# Patient Record
Sex: Male | Born: 1960
Health system: Southern US, Community
[De-identification: ages and names within clinical notes are randomized; demographics above are authoritative.]

## PROBLEM LIST (undated history)

## (undated) DIAGNOSIS — B182 Chronic viral hepatitis C: Secondary | ICD-10-CM

## (undated) DIAGNOSIS — J449 Chronic obstructive pulmonary disease, unspecified: Secondary | ICD-10-CM

## (undated) DIAGNOSIS — F1910 Other psychoactive substance abuse, uncomplicated: Secondary | ICD-10-CM

## (undated) DIAGNOSIS — E119 Type 2 diabetes mellitus without complications: Secondary | ICD-10-CM

## (undated) DIAGNOSIS — N189 Chronic kidney disease, unspecified: Secondary | ICD-10-CM

---

## 2003-12-13 ENCOUNTER — Ambulatory Visit: Payer: Self-pay | Admitting: Gastroenterology

## 2003-12-27 ENCOUNTER — Ambulatory Visit: Payer: Self-pay | Admitting: Gastroenterology

## 2004-01-10 ENCOUNTER — Ambulatory Visit: Payer: Self-pay | Admitting: Gastroenterology

## 2004-02-07 ENCOUNTER — Ambulatory Visit: Payer: Self-pay | Admitting: Gastroenterology

## 2004-03-06 ENCOUNTER — Ambulatory Visit: Payer: Self-pay | Admitting: Gastroenterology

## 2004-04-10 ENCOUNTER — Ambulatory Visit: Payer: Self-pay | Admitting: Internal Medicine

## 2004-05-11 ENCOUNTER — Ambulatory Visit: Payer: Self-pay | Admitting: Gastroenterology

## 2011-02-05 DIAGNOSIS — Z79899 Other long term (current) drug therapy: Secondary | ICD-10-CM | POA: Diagnosis not present

## 2011-03-07 DIAGNOSIS — K219 Gastro-esophageal reflux disease without esophagitis: Secondary | ICD-10-CM | POA: Diagnosis not present

## 2011-03-07 DIAGNOSIS — Z6833 Body mass index (BMI) 33.0-33.9, adult: Secondary | ICD-10-CM | POA: Diagnosis not present

## 2011-05-12 DIAGNOSIS — L0201 Cutaneous abscess of face: Secondary | ICD-10-CM | POA: Diagnosis not present

## 2011-05-12 DIAGNOSIS — N62 Hypertrophy of breast: Secondary | ICD-10-CM | POA: Diagnosis not present

## 2011-05-22 DIAGNOSIS — G8929 Other chronic pain: Secondary | ICD-10-CM | POA: Diagnosis not present

## 2011-05-22 DIAGNOSIS — Z5181 Encounter for therapeutic drug level monitoring: Secondary | ICD-10-CM | POA: Diagnosis not present

## 2011-06-19 DIAGNOSIS — Z5181 Encounter for therapeutic drug level monitoring: Secondary | ICD-10-CM | POA: Diagnosis not present

## 2011-06-19 DIAGNOSIS — G8929 Other chronic pain: Secondary | ICD-10-CM | POA: Diagnosis not present

## 2011-07-16 DIAGNOSIS — Z5181 Encounter for therapeutic drug level monitoring: Secondary | ICD-10-CM | POA: Diagnosis not present

## 2011-07-16 DIAGNOSIS — G8929 Other chronic pain: Secondary | ICD-10-CM | POA: Diagnosis not present

## 2011-08-13 DIAGNOSIS — M169 Osteoarthritis of hip, unspecified: Secondary | ICD-10-CM | POA: Diagnosis not present

## 2011-08-13 DIAGNOSIS — G894 Chronic pain syndrome: Secondary | ICD-10-CM | POA: Diagnosis not present

## 2011-08-13 DIAGNOSIS — G547 Phantom limb syndrome without pain: Secondary | ICD-10-CM | POA: Diagnosis not present

## 2011-08-28 DIAGNOSIS — Z6833 Body mass index (BMI) 33.0-33.9, adult: Secondary | ICD-10-CM | POA: Diagnosis not present

## 2011-08-28 DIAGNOSIS — N62 Hypertrophy of breast: Secondary | ICD-10-CM | POA: Diagnosis not present

## 2011-08-28 DIAGNOSIS — I10 Essential (primary) hypertension: Secondary | ICD-10-CM | POA: Diagnosis not present

## 2011-08-28 DIAGNOSIS — N529 Male erectile dysfunction, unspecified: Secondary | ICD-10-CM | POA: Diagnosis not present

## 2011-09-05 DIAGNOSIS — M169 Osteoarthritis of hip, unspecified: Secondary | ICD-10-CM | POA: Diagnosis not present

## 2011-09-05 DIAGNOSIS — G894 Chronic pain syndrome: Secondary | ICD-10-CM | POA: Diagnosis not present

## 2011-09-05 DIAGNOSIS — G547 Phantom limb syndrome without pain: Secondary | ICD-10-CM | POA: Diagnosis not present

## 2011-09-06 DIAGNOSIS — N62 Hypertrophy of breast: Secondary | ICD-10-CM | POA: Diagnosis not present

## 2011-10-08 DIAGNOSIS — Z5181 Encounter for therapeutic drug level monitoring: Secondary | ICD-10-CM | POA: Diagnosis not present

## 2011-10-08 DIAGNOSIS — G8929 Other chronic pain: Secondary | ICD-10-CM | POA: Diagnosis not present

## 2011-10-24 DIAGNOSIS — G4700 Insomnia, unspecified: Secondary | ICD-10-CM | POA: Diagnosis not present

## 2011-10-24 DIAGNOSIS — F411 Generalized anxiety disorder: Secondary | ICD-10-CM | POA: Diagnosis not present

## 2011-10-24 DIAGNOSIS — N62 Hypertrophy of breast: Secondary | ICD-10-CM | POA: Diagnosis not present

## 2011-10-25 DIAGNOSIS — N62 Hypertrophy of breast: Secondary | ICD-10-CM | POA: Diagnosis not present

## 2011-10-25 DIAGNOSIS — I10 Essential (primary) hypertension: Secondary | ICD-10-CM | POA: Diagnosis not present

## 2011-10-25 DIAGNOSIS — N63 Unspecified lump in unspecified breast: Secondary | ICD-10-CM | POA: Diagnosis not present

## 2011-10-25 DIAGNOSIS — N644 Mastodynia: Secondary | ICD-10-CM | POA: Diagnosis not present

## 2012-01-11 DIAGNOSIS — Z79899 Other long term (current) drug therapy: Secondary | ICD-10-CM | POA: Diagnosis not present

## 2012-02-01 DIAGNOSIS — G8921 Chronic pain due to trauma: Secondary | ICD-10-CM | POA: Diagnosis not present

## 2012-02-01 DIAGNOSIS — Z5181 Encounter for therapeutic drug level monitoring: Secondary | ICD-10-CM | POA: Diagnosis not present

## 2012-02-01 DIAGNOSIS — Z79899 Other long term (current) drug therapy: Secondary | ICD-10-CM | POA: Diagnosis not present

## 2012-02-01 DIAGNOSIS — G894 Chronic pain syndrome: Secondary | ICD-10-CM | POA: Diagnosis not present

## 2012-02-01 DIAGNOSIS — M2550 Pain in unspecified joint: Secondary | ICD-10-CM | POA: Diagnosis not present

## 2012-02-21 DIAGNOSIS — F17200 Nicotine dependence, unspecified, uncomplicated: Secondary | ICD-10-CM | POA: Diagnosis not present

## 2012-02-21 DIAGNOSIS — J209 Acute bronchitis, unspecified: Secondary | ICD-10-CM | POA: Diagnosis not present

## 2012-02-21 DIAGNOSIS — J449 Chronic obstructive pulmonary disease, unspecified: Secondary | ICD-10-CM | POA: Diagnosis not present

## 2012-03-12 DIAGNOSIS — M545 Low back pain: Secondary | ICD-10-CM | POA: Diagnosis not present

## 2012-03-12 DIAGNOSIS — G8921 Chronic pain due to trauma: Secondary | ICD-10-CM | POA: Diagnosis not present

## 2012-03-26 DIAGNOSIS — M25559 Pain in unspecified hip: Secondary | ICD-10-CM | POA: Diagnosis not present

## 2012-04-18 DIAGNOSIS — Z8619 Personal history of other infectious and parasitic diseases: Secondary | ICD-10-CM | POA: Diagnosis not present

## 2012-04-18 DIAGNOSIS — S78119A Complete traumatic amputation at level between unspecified hip and knee, initial encounter: Secondary | ICD-10-CM | POA: Diagnosis not present

## 2012-04-18 DIAGNOSIS — K449 Diaphragmatic hernia without obstruction or gangrene: Secondary | ICD-10-CM | POA: Diagnosis not present

## 2012-04-18 DIAGNOSIS — K219 Gastro-esophageal reflux disease without esophagitis: Secondary | ICD-10-CM | POA: Diagnosis not present

## 2012-04-18 DIAGNOSIS — F17200 Nicotine dependence, unspecified, uncomplicated: Secondary | ICD-10-CM | POA: Diagnosis not present

## 2012-04-18 DIAGNOSIS — K5900 Constipation, unspecified: Secondary | ICD-10-CM | POA: Diagnosis not present

## 2012-04-18 DIAGNOSIS — D126 Benign neoplasm of colon, unspecified: Secondary | ICD-10-CM | POA: Diagnosis not present

## 2012-04-18 DIAGNOSIS — K2100 Gastro-esophageal reflux disease with esophagitis, without bleeding: Secondary | ICD-10-CM | POA: Diagnosis not present

## 2012-04-18 DIAGNOSIS — K648 Other hemorrhoids: Secondary | ICD-10-CM | POA: Diagnosis not present

## 2012-04-18 DIAGNOSIS — Z1211 Encounter for screening for malignant neoplasm of colon: Secondary | ICD-10-CM | POA: Diagnosis not present

## 2012-04-23 DIAGNOSIS — G8921 Chronic pain due to trauma: Secondary | ICD-10-CM | POA: Diagnosis not present

## 2012-04-23 DIAGNOSIS — M545 Low back pain: Secondary | ICD-10-CM | POA: Diagnosis not present

## 2012-05-21 DIAGNOSIS — G8921 Chronic pain due to trauma: Secondary | ICD-10-CM | POA: Diagnosis not present

## 2012-05-21 DIAGNOSIS — M545 Low back pain: Secondary | ICD-10-CM | POA: Diagnosis not present

## 2012-06-11 DIAGNOSIS — Z0000 Encounter for general adult medical examination without abnormal findings: Secondary | ICD-10-CM | POA: Diagnosis not present

## 2012-06-11 DIAGNOSIS — I10 Essential (primary) hypertension: Secondary | ICD-10-CM | POA: Diagnosis not present

## 2012-06-11 DIAGNOSIS — E785 Hyperlipidemia, unspecified: Secondary | ICD-10-CM | POA: Diagnosis not present

## 2012-06-11 DIAGNOSIS — Z6833 Body mass index (BMI) 33.0-33.9, adult: Secondary | ICD-10-CM | POA: Diagnosis not present

## 2012-06-11 DIAGNOSIS — R350 Frequency of micturition: Secondary | ICD-10-CM | POA: Diagnosis not present

## 2012-06-11 DIAGNOSIS — J209 Acute bronchitis, unspecified: Secondary | ICD-10-CM | POA: Diagnosis not present

## 2012-06-11 DIAGNOSIS — E119 Type 2 diabetes mellitus without complications: Secondary | ICD-10-CM | POA: Diagnosis not present

## 2012-06-18 DIAGNOSIS — M545 Low back pain: Secondary | ICD-10-CM | POA: Diagnosis not present

## 2012-06-18 DIAGNOSIS — G8921 Chronic pain due to trauma: Secondary | ICD-10-CM | POA: Diagnosis not present

## 2012-06-18 DIAGNOSIS — Z5181 Encounter for therapeutic drug level monitoring: Secondary | ICD-10-CM | POA: Diagnosis not present

## 2012-06-27 DIAGNOSIS — Z6834 Body mass index (BMI) 34.0-34.9, adult: Secondary | ICD-10-CM | POA: Diagnosis not present

## 2012-06-27 DIAGNOSIS — E7289 Other specified disorders of amino-acid metabolism: Secondary | ICD-10-CM | POA: Diagnosis not present

## 2012-06-27 DIAGNOSIS — R609 Edema, unspecified: Secondary | ICD-10-CM | POA: Diagnosis not present

## 2012-06-27 DIAGNOSIS — I10 Essential (primary) hypertension: Secondary | ICD-10-CM | POA: Diagnosis not present

## 2012-07-07 DIAGNOSIS — I10 Essential (primary) hypertension: Secondary | ICD-10-CM | POA: Diagnosis not present

## 2012-07-07 DIAGNOSIS — Z6833 Body mass index (BMI) 33.0-33.9, adult: Secondary | ICD-10-CM | POA: Diagnosis not present

## 2012-07-07 DIAGNOSIS — R609 Edema, unspecified: Secondary | ICD-10-CM | POA: Diagnosis not present

## 2012-07-16 DIAGNOSIS — G8921 Chronic pain due to trauma: Secondary | ICD-10-CM | POA: Diagnosis not present

## 2012-07-16 DIAGNOSIS — M545 Low back pain: Secondary | ICD-10-CM | POA: Diagnosis not present

## 2012-08-13 DIAGNOSIS — G8921 Chronic pain due to trauma: Secondary | ICD-10-CM | POA: Diagnosis not present

## 2012-08-13 DIAGNOSIS — M545 Low back pain: Secondary | ICD-10-CM | POA: Diagnosis not present

## 2012-09-09 DIAGNOSIS — G8921 Chronic pain due to trauma: Secondary | ICD-10-CM | POA: Diagnosis not present

## 2012-09-09 DIAGNOSIS — Z79899 Other long term (current) drug therapy: Secondary | ICD-10-CM | POA: Diagnosis not present

## 2012-09-09 DIAGNOSIS — Z5181 Encounter for therapeutic drug level monitoring: Secondary | ICD-10-CM | POA: Diagnosis not present

## 2012-09-09 DIAGNOSIS — G894 Chronic pain syndrome: Secondary | ICD-10-CM | POA: Diagnosis not present

## 2012-09-30 DIAGNOSIS — G8921 Chronic pain due to trauma: Secondary | ICD-10-CM | POA: Diagnosis not present

## 2012-09-30 DIAGNOSIS — Z79899 Other long term (current) drug therapy: Secondary | ICD-10-CM | POA: Diagnosis not present

## 2012-09-30 DIAGNOSIS — Z5181 Encounter for therapeutic drug level monitoring: Secondary | ICD-10-CM | POA: Diagnosis not present

## 2012-09-30 DIAGNOSIS — G894 Chronic pain syndrome: Secondary | ICD-10-CM | POA: Diagnosis not present

## 2012-10-09 DIAGNOSIS — Z6832 Body mass index (BMI) 32.0-32.9, adult: Secondary | ICD-10-CM | POA: Diagnosis not present

## 2012-10-09 DIAGNOSIS — I10 Essential (primary) hypertension: Secondary | ICD-10-CM | POA: Diagnosis not present

## 2012-10-09 DIAGNOSIS — E119 Type 2 diabetes mellitus without complications: Secondary | ICD-10-CM | POA: Diagnosis not present

## 2012-10-09 DIAGNOSIS — E785 Hyperlipidemia, unspecified: Secondary | ICD-10-CM | POA: Diagnosis not present

## 2012-10-27 DIAGNOSIS — G894 Chronic pain syndrome: Secondary | ICD-10-CM | POA: Diagnosis not present

## 2012-10-27 DIAGNOSIS — G8921 Chronic pain due to trauma: Secondary | ICD-10-CM | POA: Diagnosis not present

## 2012-10-27 DIAGNOSIS — Z5181 Encounter for therapeutic drug level monitoring: Secondary | ICD-10-CM | POA: Diagnosis not present

## 2012-10-27 DIAGNOSIS — Z79899 Other long term (current) drug therapy: Secondary | ICD-10-CM | POA: Diagnosis not present

## 2012-11-11 DIAGNOSIS — Z79899 Other long term (current) drug therapy: Secondary | ICD-10-CM | POA: Diagnosis not present

## 2012-11-11 DIAGNOSIS — G8921 Chronic pain due to trauma: Secondary | ICD-10-CM | POA: Diagnosis not present

## 2012-11-11 DIAGNOSIS — G894 Chronic pain syndrome: Secondary | ICD-10-CM | POA: Diagnosis not present

## 2012-11-11 DIAGNOSIS — Z5181 Encounter for therapeutic drug level monitoring: Secondary | ICD-10-CM | POA: Diagnosis not present

## 2012-12-09 DIAGNOSIS — Z5181 Encounter for therapeutic drug level monitoring: Secondary | ICD-10-CM | POA: Diagnosis not present

## 2012-12-09 DIAGNOSIS — G894 Chronic pain syndrome: Secondary | ICD-10-CM | POA: Diagnosis not present

## 2012-12-09 DIAGNOSIS — G8921 Chronic pain due to trauma: Secondary | ICD-10-CM | POA: Diagnosis not present

## 2012-12-09 DIAGNOSIS — Z79899 Other long term (current) drug therapy: Secondary | ICD-10-CM | POA: Diagnosis not present

## 2013-01-06 DIAGNOSIS — G8921 Chronic pain due to trauma: Secondary | ICD-10-CM | POA: Diagnosis not present

## 2013-01-06 DIAGNOSIS — Z79899 Other long term (current) drug therapy: Secondary | ICD-10-CM | POA: Diagnosis not present

## 2013-01-06 DIAGNOSIS — Z5181 Encounter for therapeutic drug level monitoring: Secondary | ICD-10-CM | POA: Diagnosis not present

## 2013-01-06 DIAGNOSIS — G894 Chronic pain syndrome: Secondary | ICD-10-CM | POA: Diagnosis not present

## 2013-01-14 DIAGNOSIS — F411 Generalized anxiety disorder: Secondary | ICD-10-CM | POA: Diagnosis not present

## 2013-01-14 DIAGNOSIS — E785 Hyperlipidemia, unspecified: Secondary | ICD-10-CM | POA: Diagnosis not present

## 2013-01-14 DIAGNOSIS — I10 Essential (primary) hypertension: Secondary | ICD-10-CM | POA: Diagnosis not present

## 2013-02-03 DIAGNOSIS — Z5181 Encounter for therapeutic drug level monitoring: Secondary | ICD-10-CM | POA: Diagnosis not present

## 2013-02-03 DIAGNOSIS — G894 Chronic pain syndrome: Secondary | ICD-10-CM | POA: Diagnosis not present

## 2013-02-03 DIAGNOSIS — G8921 Chronic pain due to trauma: Secondary | ICD-10-CM | POA: Diagnosis not present

## 2013-02-03 DIAGNOSIS — Z79899 Other long term (current) drug therapy: Secondary | ICD-10-CM | POA: Diagnosis not present

## 2013-03-03 DIAGNOSIS — M2550 Pain in unspecified joint: Secondary | ICD-10-CM | POA: Diagnosis not present

## 2013-03-03 DIAGNOSIS — Z79899 Other long term (current) drug therapy: Secondary | ICD-10-CM | POA: Diagnosis not present

## 2013-03-03 DIAGNOSIS — Z5181 Encounter for therapeutic drug level monitoring: Secondary | ICD-10-CM | POA: Diagnosis not present

## 2013-03-03 DIAGNOSIS — G894 Chronic pain syndrome: Secondary | ICD-10-CM | POA: Diagnosis not present

## 2013-03-04 DIAGNOSIS — M25569 Pain in unspecified knee: Secondary | ICD-10-CM | POA: Diagnosis not present

## 2013-03-05 DIAGNOSIS — M1710 Unilateral primary osteoarthritis, unspecified knee: Secondary | ICD-10-CM | POA: Diagnosis not present

## 2013-03-09 DIAGNOSIS — M1710 Unilateral primary osteoarthritis, unspecified knee: Secondary | ICD-10-CM | POA: Diagnosis not present

## 2013-03-31 DIAGNOSIS — Z5181 Encounter for therapeutic drug level monitoring: Secondary | ICD-10-CM | POA: Diagnosis not present

## 2013-03-31 DIAGNOSIS — M2550 Pain in unspecified joint: Secondary | ICD-10-CM | POA: Diagnosis not present

## 2013-03-31 DIAGNOSIS — Z79899 Other long term (current) drug therapy: Secondary | ICD-10-CM | POA: Diagnosis not present

## 2013-03-31 DIAGNOSIS — G894 Chronic pain syndrome: Secondary | ICD-10-CM | POA: Diagnosis not present

## 2013-04-02 DIAGNOSIS — IMO0001 Reserved for inherently not codable concepts without codable children: Secondary | ICD-10-CM | POA: Diagnosis not present

## 2013-04-02 DIAGNOSIS — E785 Hyperlipidemia, unspecified: Secondary | ICD-10-CM | POA: Diagnosis not present

## 2013-04-02 DIAGNOSIS — J209 Acute bronchitis, unspecified: Secondary | ICD-10-CM | POA: Diagnosis not present

## 2013-04-02 DIAGNOSIS — Z6834 Body mass index (BMI) 34.0-34.9, adult: Secondary | ICD-10-CM | POA: Diagnosis not present

## 2013-04-02 DIAGNOSIS — I10 Essential (primary) hypertension: Secondary | ICD-10-CM | POA: Diagnosis not present

## 2013-05-27 DIAGNOSIS — Z6834 Body mass index (BMI) 34.0-34.9, adult: Secondary | ICD-10-CM | POA: Diagnosis not present

## 2013-05-27 DIAGNOSIS — J209 Acute bronchitis, unspecified: Secondary | ICD-10-CM | POA: Diagnosis not present

## 2013-05-27 DIAGNOSIS — F17200 Nicotine dependence, unspecified, uncomplicated: Secondary | ICD-10-CM | POA: Diagnosis not present

## 2013-05-28 DIAGNOSIS — Z79899 Other long term (current) drug therapy: Secondary | ICD-10-CM | POA: Diagnosis not present

## 2013-05-28 DIAGNOSIS — G894 Chronic pain syndrome: Secondary | ICD-10-CM | POA: Diagnosis not present

## 2013-05-28 DIAGNOSIS — M2550 Pain in unspecified joint: Secondary | ICD-10-CM | POA: Diagnosis not present

## 2013-05-28 DIAGNOSIS — Z5181 Encounter for therapeutic drug level monitoring: Secondary | ICD-10-CM | POA: Diagnosis not present

## 2013-06-04 DIAGNOSIS — F17200 Nicotine dependence, unspecified, uncomplicated: Secondary | ICD-10-CM | POA: Diagnosis not present

## 2013-06-04 DIAGNOSIS — J209 Acute bronchitis, unspecified: Secondary | ICD-10-CM | POA: Diagnosis not present

## 2013-06-04 DIAGNOSIS — Z6833 Body mass index (BMI) 33.0-33.9, adult: Secondary | ICD-10-CM | POA: Diagnosis not present

## 2013-06-25 DIAGNOSIS — Z79899 Other long term (current) drug therapy: Secondary | ICD-10-CM | POA: Diagnosis not present

## 2013-06-25 DIAGNOSIS — Z5181 Encounter for therapeutic drug level monitoring: Secondary | ICD-10-CM | POA: Diagnosis not present

## 2013-06-25 DIAGNOSIS — G894 Chronic pain syndrome: Secondary | ICD-10-CM | POA: Diagnosis not present

## 2013-06-25 DIAGNOSIS — M2550 Pain in unspecified joint: Secondary | ICD-10-CM | POA: Diagnosis not present

## 2013-07-07 DIAGNOSIS — L981 Factitial dermatitis: Secondary | ICD-10-CM | POA: Diagnosis not present

## 2013-07-07 DIAGNOSIS — L259 Unspecified contact dermatitis, unspecified cause: Secondary | ICD-10-CM | POA: Diagnosis not present

## 2013-07-09 DIAGNOSIS — Z5181 Encounter for therapeutic drug level monitoring: Secondary | ICD-10-CM | POA: Diagnosis not present

## 2013-07-09 DIAGNOSIS — M2550 Pain in unspecified joint: Secondary | ICD-10-CM | POA: Diagnosis not present

## 2013-07-09 DIAGNOSIS — Z79899 Other long term (current) drug therapy: Secondary | ICD-10-CM | POA: Diagnosis not present

## 2013-07-09 DIAGNOSIS — G894 Chronic pain syndrome: Secondary | ICD-10-CM | POA: Diagnosis not present

## 2013-08-26 DIAGNOSIS — Z79899 Other long term (current) drug therapy: Secondary | ICD-10-CM | POA: Diagnosis not present

## 2013-08-26 DIAGNOSIS — Z5181 Encounter for therapeutic drug level monitoring: Secondary | ICD-10-CM | POA: Diagnosis not present

## 2013-08-26 DIAGNOSIS — G894 Chronic pain syndrome: Secondary | ICD-10-CM | POA: Diagnosis not present

## 2013-08-26 DIAGNOSIS — M2550 Pain in unspecified joint: Secondary | ICD-10-CM | POA: Diagnosis not present

## 2013-09-01 DIAGNOSIS — Z6834 Body mass index (BMI) 34.0-34.9, adult: Secondary | ICD-10-CM | POA: Diagnosis not present

## 2013-09-01 DIAGNOSIS — I10 Essential (primary) hypertension: Secondary | ICD-10-CM | POA: Diagnosis not present

## 2013-09-01 DIAGNOSIS — IMO0001 Reserved for inherently not codable concepts without codable children: Secondary | ICD-10-CM | POA: Diagnosis not present

## 2013-09-01 DIAGNOSIS — R079 Chest pain, unspecified: Secondary | ICD-10-CM | POA: Diagnosis not present

## 2013-09-01 DIAGNOSIS — Z125 Encounter for screening for malignant neoplasm of prostate: Secondary | ICD-10-CM | POA: Diagnosis not present

## 2013-09-01 DIAGNOSIS — E785 Hyperlipidemia, unspecified: Secondary | ICD-10-CM | POA: Diagnosis not present

## 2013-09-02 DIAGNOSIS — R079 Chest pain, unspecified: Secondary | ICD-10-CM | POA: Diagnosis not present

## 2013-09-02 DIAGNOSIS — R609 Edema, unspecified: Secondary | ICD-10-CM | POA: Diagnosis not present

## 2013-09-02 DIAGNOSIS — E119 Type 2 diabetes mellitus without complications: Secondary | ICD-10-CM | POA: Diagnosis not present

## 2013-09-02 DIAGNOSIS — J449 Chronic obstructive pulmonary disease, unspecified: Secondary | ICD-10-CM | POA: Diagnosis not present

## 2013-09-14 DIAGNOSIS — H04129 Dry eye syndrome of unspecified lacrimal gland: Secondary | ICD-10-CM | POA: Diagnosis not present

## 2013-09-24 DIAGNOSIS — R079 Chest pain, unspecified: Secondary | ICD-10-CM | POA: Diagnosis not present

## 2013-10-05 DIAGNOSIS — R9439 Abnormal result of other cardiovascular function study: Secondary | ICD-10-CM | POA: Diagnosis not present

## 2013-10-05 DIAGNOSIS — I10 Essential (primary) hypertension: Secondary | ICD-10-CM | POA: Diagnosis not present

## 2013-10-05 DIAGNOSIS — Z79899 Other long term (current) drug therapy: Secondary | ICD-10-CM | POA: Diagnosis not present

## 2013-10-05 DIAGNOSIS — Z01818 Encounter for other preprocedural examination: Secondary | ICD-10-CM | POA: Diagnosis not present

## 2013-10-05 DIAGNOSIS — Z7982 Long term (current) use of aspirin: Secondary | ICD-10-CM | POA: Diagnosis not present

## 2013-10-05 DIAGNOSIS — R0789 Other chest pain: Secondary | ICD-10-CM | POA: Diagnosis not present

## 2013-10-05 DIAGNOSIS — Z87891 Personal history of nicotine dependence: Secondary | ICD-10-CM | POA: Diagnosis not present

## 2013-10-05 DIAGNOSIS — J449 Chronic obstructive pulmonary disease, unspecified: Secondary | ICD-10-CM | POA: Diagnosis not present

## 2013-10-05 DIAGNOSIS — I2510 Atherosclerotic heart disease of native coronary artery without angina pectoris: Secondary | ICD-10-CM | POA: Diagnosis not present

## 2013-10-05 DIAGNOSIS — B182 Chronic viral hepatitis C: Secondary | ICD-10-CM | POA: Diagnosis not present

## 2013-10-05 DIAGNOSIS — R079 Chest pain, unspecified: Secondary | ICD-10-CM | POA: Diagnosis not present

## 2013-10-05 DIAGNOSIS — E119 Type 2 diabetes mellitus without complications: Secondary | ICD-10-CM | POA: Diagnosis not present

## 2013-10-07 DIAGNOSIS — I209 Angina pectoris, unspecified: Secondary | ICD-10-CM | POA: Diagnosis not present

## 2013-10-08 DIAGNOSIS — G8921 Chronic pain due to trauma: Secondary | ICD-10-CM | POA: Diagnosis not present

## 2013-10-08 DIAGNOSIS — M545 Low back pain, unspecified: Secondary | ICD-10-CM | POA: Diagnosis not present

## 2013-11-02 DIAGNOSIS — Z79891 Long term (current) use of opiate analgesic: Secondary | ICD-10-CM | POA: Diagnosis not present

## 2013-11-02 DIAGNOSIS — M2550 Pain in unspecified joint: Secondary | ICD-10-CM | POA: Diagnosis not present

## 2013-11-02 DIAGNOSIS — Z5181 Encounter for therapeutic drug level monitoring: Secondary | ICD-10-CM | POA: Diagnosis not present

## 2013-11-02 DIAGNOSIS — G894 Chronic pain syndrome: Secondary | ICD-10-CM | POA: Diagnosis not present

## 2013-11-05 DIAGNOSIS — M5136 Other intervertebral disc degeneration, lumbar region: Secondary | ICD-10-CM | POA: Diagnosis not present

## 2013-11-05 DIAGNOSIS — M5412 Radiculopathy, cervical region: Secondary | ICD-10-CM | POA: Diagnosis not present

## 2013-12-15 DIAGNOSIS — Z79899 Other long term (current) drug therapy: Secondary | ICD-10-CM | POA: Diagnosis not present

## 2013-12-16 DIAGNOSIS — R079 Chest pain, unspecified: Secondary | ICD-10-CM | POA: Diagnosis not present

## 2013-12-16 DIAGNOSIS — J449 Chronic obstructive pulmonary disease, unspecified: Secondary | ICD-10-CM | POA: Diagnosis not present

## 2013-12-16 DIAGNOSIS — R609 Edema, unspecified: Secondary | ICD-10-CM | POA: Diagnosis not present

## 2013-12-18 DIAGNOSIS — J449 Chronic obstructive pulmonary disease, unspecified: Secondary | ICD-10-CM | POA: Diagnosis not present

## 2013-12-18 DIAGNOSIS — Z6834 Body mass index (BMI) 34.0-34.9, adult: Secondary | ICD-10-CM | POA: Diagnosis not present

## 2013-12-18 DIAGNOSIS — E1165 Type 2 diabetes mellitus with hyperglycemia: Secondary | ICD-10-CM | POA: Diagnosis not present

## 2013-12-18 DIAGNOSIS — F17200 Nicotine dependence, unspecified, uncomplicated: Secondary | ICD-10-CM | POA: Diagnosis not present

## 2013-12-18 DIAGNOSIS — R600 Localized edema: Secondary | ICD-10-CM | POA: Diagnosis not present

## 2013-12-18 DIAGNOSIS — K219 Gastro-esophageal reflux disease without esophagitis: Secondary | ICD-10-CM | POA: Diagnosis not present

## 2013-12-18 DIAGNOSIS — G4734 Idiopathic sleep related nonobstructive alveolar hypoventilation: Secondary | ICD-10-CM | POA: Diagnosis not present

## 2013-12-18 DIAGNOSIS — Z23 Encounter for immunization: Secondary | ICD-10-CM | POA: Diagnosis not present

## 2013-12-29 DIAGNOSIS — Z79899 Other long term (current) drug therapy: Secondary | ICD-10-CM | POA: Diagnosis not present

## 2014-02-15 DIAGNOSIS — Z79899 Other long term (current) drug therapy: Secondary | ICD-10-CM | POA: Diagnosis not present

## 2014-02-22 DIAGNOSIS — Z79891 Long term (current) use of opiate analgesic: Secondary | ICD-10-CM | POA: Diagnosis not present

## 2014-03-11 DIAGNOSIS — Z6833 Body mass index (BMI) 33.0-33.9, adult: Secondary | ICD-10-CM | POA: Diagnosis not present

## 2014-03-11 DIAGNOSIS — J449 Chronic obstructive pulmonary disease, unspecified: Secondary | ICD-10-CM | POA: Diagnosis not present

## 2014-03-11 DIAGNOSIS — E1165 Type 2 diabetes mellitus with hyperglycemia: Secondary | ICD-10-CM | POA: Diagnosis not present

## 2014-03-11 DIAGNOSIS — G4734 Idiopathic sleep related nonobstructive alveolar hypoventilation: Secondary | ICD-10-CM | POA: Diagnosis not present

## 2014-03-11 DIAGNOSIS — F411 Generalized anxiety disorder: Secondary | ICD-10-CM | POA: Diagnosis not present

## 2014-03-11 DIAGNOSIS — E291 Testicular hypofunction: Secondary | ICD-10-CM | POA: Diagnosis not present

## 2014-03-11 DIAGNOSIS — F17200 Nicotine dependence, unspecified, uncomplicated: Secondary | ICD-10-CM | POA: Diagnosis not present

## 2014-03-11 DIAGNOSIS — R600 Localized edema: Secondary | ICD-10-CM | POA: Diagnosis not present

## 2014-03-30 DIAGNOSIS — E1165 Type 2 diabetes mellitus with hyperglycemia: Secondary | ICD-10-CM | POA: Diagnosis not present

## 2014-03-30 DIAGNOSIS — J208 Acute bronchitis due to other specified organisms: Secondary | ICD-10-CM | POA: Diagnosis not present

## 2014-03-30 DIAGNOSIS — Z6834 Body mass index (BMI) 34.0-34.9, adult: Secondary | ICD-10-CM | POA: Diagnosis not present

## 2014-04-19 DIAGNOSIS — Z79891 Long term (current) use of opiate analgesic: Secondary | ICD-10-CM | POA: Diagnosis not present

## 2014-05-03 DIAGNOSIS — Z79891 Long term (current) use of opiate analgesic: Secondary | ICD-10-CM | POA: Diagnosis not present

## 2015-04-21 DIAGNOSIS — E1165 Type 2 diabetes mellitus with hyperglycemia: Secondary | ICD-10-CM | POA: Diagnosis not present

## 2015-04-21 DIAGNOSIS — F411 Generalized anxiety disorder: Secondary | ICD-10-CM | POA: Diagnosis not present

## 2015-04-21 DIAGNOSIS — Z6828 Body mass index (BMI) 28.0-28.9, adult: Secondary | ICD-10-CM | POA: Diagnosis not present

## 2015-04-21 DIAGNOSIS — J449 Chronic obstructive pulmonary disease, unspecified: Secondary | ICD-10-CM | POA: Diagnosis not present

## 2015-04-21 DIAGNOSIS — N529 Male erectile dysfunction, unspecified: Secondary | ICD-10-CM | POA: Diagnosis not present

## 2015-04-21 DIAGNOSIS — R600 Localized edema: Secondary | ICD-10-CM | POA: Diagnosis not present

## 2015-04-21 DIAGNOSIS — F17200 Nicotine dependence, unspecified, uncomplicated: Secondary | ICD-10-CM | POA: Diagnosis not present

## 2015-04-21 DIAGNOSIS — K219 Gastro-esophageal reflux disease without esophagitis: Secondary | ICD-10-CM | POA: Diagnosis not present

## 2015-06-17 DIAGNOSIS — F411 Generalized anxiety disorder: Secondary | ICD-10-CM | POA: Diagnosis not present

## 2015-06-17 DIAGNOSIS — R21 Rash and other nonspecific skin eruption: Secondary | ICD-10-CM | POA: Diagnosis not present

## 2015-06-17 DIAGNOSIS — E663 Overweight: Secondary | ICD-10-CM | POA: Diagnosis not present

## 2015-06-17 DIAGNOSIS — E1165 Type 2 diabetes mellitus with hyperglycemia: Secondary | ICD-10-CM | POA: Diagnosis not present

## 2015-06-17 DIAGNOSIS — Z6827 Body mass index (BMI) 27.0-27.9, adult: Secondary | ICD-10-CM | POA: Diagnosis not present

## 2015-06-17 DIAGNOSIS — Z125 Encounter for screening for malignant neoplasm of prostate: Secondary | ICD-10-CM | POA: Diagnosis not present

## 2015-06-17 DIAGNOSIS — R600 Localized edema: Secondary | ICD-10-CM | POA: Diagnosis not present

## 2015-06-17 DIAGNOSIS — K219 Gastro-esophageal reflux disease without esophagitis: Secondary | ICD-10-CM | POA: Diagnosis not present

## 2015-06-17 DIAGNOSIS — F17200 Nicotine dependence, unspecified, uncomplicated: Secondary | ICD-10-CM | POA: Diagnosis not present

## 2015-08-26 DIAGNOSIS — R600 Localized edema: Secondary | ICD-10-CM | POA: Diagnosis not present

## 2015-08-26 DIAGNOSIS — E1129 Type 2 diabetes mellitus with other diabetic kidney complication: Secondary | ICD-10-CM | POA: Diagnosis not present

## 2015-08-26 DIAGNOSIS — E785 Hyperlipidemia, unspecified: Secondary | ICD-10-CM | POA: Diagnosis not present

## 2015-08-26 DIAGNOSIS — F17200 Nicotine dependence, unspecified, uncomplicated: Secondary | ICD-10-CM | POA: Diagnosis not present

## 2015-08-26 DIAGNOSIS — Z6830 Body mass index (BMI) 30.0-30.9, adult: Secondary | ICD-10-CM | POA: Diagnosis not present

## 2015-08-26 DIAGNOSIS — I10 Essential (primary) hypertension: Secondary | ICD-10-CM | POA: Diagnosis not present

## 2015-08-26 DIAGNOSIS — B80 Enterobiasis: Secondary | ICD-10-CM | POA: Diagnosis not present

## 2015-08-26 DIAGNOSIS — K219 Gastro-esophageal reflux disease without esophagitis: Secondary | ICD-10-CM | POA: Diagnosis not present

## 2015-08-26 DIAGNOSIS — F418 Other specified anxiety disorders: Secondary | ICD-10-CM | POA: Diagnosis not present

## 2015-10-08 DIAGNOSIS — I10 Essential (primary) hypertension: Secondary | ICD-10-CM | POA: Diagnosis not present

## 2015-10-08 DIAGNOSIS — Z720 Tobacco use: Secondary | ICD-10-CM | POA: Diagnosis not present

## 2015-10-08 DIAGNOSIS — R109 Unspecified abdominal pain: Secondary | ICD-10-CM | POA: Diagnosis not present

## 2015-10-08 DIAGNOSIS — K219 Gastro-esophageal reflux disease without esophagitis: Secondary | ICD-10-CM | POA: Diagnosis not present

## 2015-10-08 DIAGNOSIS — K8000 Calculus of gallbladder with acute cholecystitis without obstruction: Secondary | ICD-10-CM | POA: Diagnosis not present

## 2015-10-08 DIAGNOSIS — J189 Pneumonia, unspecified organism: Secondary | ICD-10-CM | POA: Diagnosis not present

## 2015-10-08 DIAGNOSIS — F17210 Nicotine dependence, cigarettes, uncomplicated: Secondary | ICD-10-CM | POA: Diagnosis not present

## 2015-10-08 DIAGNOSIS — K810 Acute cholecystitis: Secondary | ICD-10-CM | POA: Diagnosis not present

## 2015-10-08 DIAGNOSIS — E119 Type 2 diabetes mellitus without complications: Secondary | ICD-10-CM | POA: Diagnosis not present

## 2015-10-08 DIAGNOSIS — Z89612 Acquired absence of left leg above knee: Secondary | ICD-10-CM | POA: Diagnosis not present

## 2015-10-09 DIAGNOSIS — Z89612 Acquired absence of left leg above knee: Secondary | ICD-10-CM | POA: Diagnosis not present

## 2015-10-09 DIAGNOSIS — K8012 Calculus of gallbladder with acute and chronic cholecystitis without obstruction: Secondary | ICD-10-CM | POA: Diagnosis not present

## 2015-10-09 DIAGNOSIS — K219 Gastro-esophageal reflux disease without esophagitis: Secondary | ICD-10-CM | POA: Diagnosis not present

## 2015-10-09 DIAGNOSIS — K8010 Calculus of gallbladder with chronic cholecystitis without obstruction: Secondary | ICD-10-CM | POA: Diagnosis not present

## 2015-10-09 DIAGNOSIS — Z79899 Other long term (current) drug therapy: Secondary | ICD-10-CM | POA: Diagnosis not present

## 2015-10-09 DIAGNOSIS — K810 Acute cholecystitis: Secondary | ICD-10-CM | POA: Diagnosis not present

## 2015-10-09 DIAGNOSIS — E119 Type 2 diabetes mellitus without complications: Secondary | ICD-10-CM | POA: Diagnosis not present

## 2015-10-09 DIAGNOSIS — K8050 Calculus of bile duct without cholangitis or cholecystitis without obstruction: Secondary | ICD-10-CM | POA: Diagnosis not present

## 2015-10-09 DIAGNOSIS — F17210 Nicotine dependence, cigarettes, uncomplicated: Secondary | ICD-10-CM | POA: Diagnosis present

## 2015-10-09 DIAGNOSIS — K8000 Calculus of gallbladder with acute cholecystitis without obstruction: Secondary | ICD-10-CM | POA: Diagnosis present

## 2015-10-09 DIAGNOSIS — R932 Abnormal findings on diagnostic imaging of liver and biliary tract: Secondary | ICD-10-CM | POA: Diagnosis not present

## 2015-10-09 DIAGNOSIS — I10 Essential (primary) hypertension: Secondary | ICD-10-CM | POA: Diagnosis not present

## 2015-10-09 DIAGNOSIS — Z880 Allergy status to penicillin: Secondary | ICD-10-CM | POA: Diagnosis not present

## 2015-10-09 DIAGNOSIS — K8046 Calculus of bile duct with acute and chronic cholecystitis without obstruction: Secondary | ICD-10-CM | POA: Diagnosis not present

## 2015-10-11 DIAGNOSIS — Z96641 Presence of right artificial hip joint: Secondary | ICD-10-CM | POA: Diagnosis present

## 2015-10-11 DIAGNOSIS — Z79899 Other long term (current) drug therapy: Secondary | ICD-10-CM | POA: Diagnosis not present

## 2015-10-11 DIAGNOSIS — R112 Nausea with vomiting, unspecified: Secondary | ICD-10-CM | POA: Diagnosis not present

## 2015-10-11 DIAGNOSIS — R1084 Generalized abdominal pain: Secondary | ICD-10-CM | POA: Diagnosis not present

## 2015-10-11 DIAGNOSIS — K8042 Calculus of bile duct with acute cholecystitis without obstruction: Secondary | ICD-10-CM | POA: Diagnosis present

## 2015-10-11 DIAGNOSIS — J986 Disorders of diaphragm: Secondary | ICD-10-CM | POA: Diagnosis present

## 2015-10-11 DIAGNOSIS — R109 Unspecified abdominal pain: Secondary | ICD-10-CM | POA: Diagnosis not present

## 2015-10-11 DIAGNOSIS — E119 Type 2 diabetes mellitus without complications: Secondary | ICD-10-CM | POA: Diagnosis not present

## 2015-10-11 DIAGNOSIS — F17210 Nicotine dependence, cigarettes, uncomplicated: Secondary | ICD-10-CM | POA: Diagnosis present

## 2015-10-11 DIAGNOSIS — I10 Essential (primary) hypertension: Secondary | ICD-10-CM | POA: Diagnosis present

## 2015-10-11 DIAGNOSIS — Z880 Allergy status to penicillin: Secondary | ICD-10-CM | POA: Diagnosis not present

## 2015-10-11 DIAGNOSIS — K219 Gastro-esophageal reflux disease without esophagitis: Secondary | ICD-10-CM | POA: Diagnosis present

## 2015-10-11 DIAGNOSIS — R748 Abnormal levels of other serum enzymes: Secondary | ICD-10-CM | POA: Diagnosis not present

## 2015-10-11 DIAGNOSIS — R932 Abnormal findings on diagnostic imaging of liver and biliary tract: Secondary | ICD-10-CM | POA: Diagnosis not present

## 2015-10-11 DIAGNOSIS — K8050 Calculus of bile duct without cholangitis or cholecystitis without obstruction: Secondary | ICD-10-CM | POA: Diagnosis not present

## 2015-10-11 DIAGNOSIS — Z89612 Acquired absence of left leg above knee: Secondary | ICD-10-CM | POA: Diagnosis not present

## 2015-10-11 DIAGNOSIS — K819 Cholecystitis, unspecified: Secondary | ICD-10-CM | POA: Diagnosis not present

## 2015-10-11 DIAGNOSIS — Z7984 Long term (current) use of oral hypoglycemic drugs: Secondary | ICD-10-CM | POA: Diagnosis not present

## 2015-10-18 DIAGNOSIS — I10 Essential (primary) hypertension: Secondary | ICD-10-CM | POA: Diagnosis not present

## 2015-10-18 DIAGNOSIS — K219 Gastro-esophageal reflux disease without esophagitis: Secondary | ICD-10-CM | POA: Diagnosis not present

## 2015-10-18 DIAGNOSIS — R05 Cough: Secondary | ICD-10-CM | POA: Diagnosis not present

## 2015-10-18 DIAGNOSIS — E1142 Type 2 diabetes mellitus with diabetic polyneuropathy: Secondary | ICD-10-CM | POA: Diagnosis not present

## 2015-10-18 DIAGNOSIS — R195 Other fecal abnormalities: Secondary | ICD-10-CM | POA: Diagnosis not present

## 2015-10-18 DIAGNOSIS — J449 Chronic obstructive pulmonary disease, unspecified: Secondary | ICD-10-CM | POA: Diagnosis not present

## 2015-10-18 DIAGNOSIS — Z13220 Encounter for screening for lipoid disorders: Secondary | ICD-10-CM | POA: Diagnosis not present

## 2015-10-18 DIAGNOSIS — F419 Anxiety disorder, unspecified: Secondary | ICD-10-CM | POA: Diagnosis not present

## 2015-10-18 DIAGNOSIS — Z89612 Acquired absence of left leg above knee: Secondary | ICD-10-CM | POA: Diagnosis not present

## 2015-10-18 DIAGNOSIS — Z9049 Acquired absence of other specified parts of digestive tract: Secondary | ICD-10-CM | POA: Diagnosis not present

## 2015-11-01 DIAGNOSIS — Z13220 Encounter for screening for lipoid disorders: Secondary | ICD-10-CM | POA: Diagnosis not present

## 2015-11-01 DIAGNOSIS — R195 Other fecal abnormalities: Secondary | ICD-10-CM | POA: Diagnosis not present

## 2015-11-01 DIAGNOSIS — E1142 Type 2 diabetes mellitus with diabetic polyneuropathy: Secondary | ICD-10-CM | POA: Diagnosis not present

## 2015-11-01 DIAGNOSIS — I10 Essential (primary) hypertension: Secondary | ICD-10-CM | POA: Diagnosis not present

## 2015-11-03 DIAGNOSIS — J209 Acute bronchitis, unspecified: Secondary | ICD-10-CM | POA: Diagnosis not present

## 2015-11-03 DIAGNOSIS — J440 Chronic obstructive pulmonary disease with acute lower respiratory infection: Secondary | ICD-10-CM | POA: Diagnosis not present

## 2015-11-30 DIAGNOSIS — F411 Generalized anxiety disorder: Secondary | ICD-10-CM | POA: Diagnosis not present

## 2015-11-30 DIAGNOSIS — Z89612 Acquired absence of left leg above knee: Secondary | ICD-10-CM | POA: Diagnosis not present

## 2015-11-30 DIAGNOSIS — R21 Rash and other nonspecific skin eruption: Secondary | ICD-10-CM | POA: Diagnosis not present

## 2015-11-30 DIAGNOSIS — Z79899 Other long term (current) drug therapy: Secondary | ICD-10-CM | POA: Diagnosis not present

## 2015-11-30 DIAGNOSIS — M7989 Other specified soft tissue disorders: Secondary | ICD-10-CM | POA: Diagnosis not present

## 2015-11-30 DIAGNOSIS — E119 Type 2 diabetes mellitus without complications: Secondary | ICD-10-CM | POA: Diagnosis not present

## 2015-11-30 DIAGNOSIS — I10 Essential (primary) hypertension: Secondary | ICD-10-CM | POA: Diagnosis not present

## 2015-11-30 DIAGNOSIS — Z23 Encounter for immunization: Secondary | ICD-10-CM | POA: Diagnosis not present

## 2015-11-30 DIAGNOSIS — K219 Gastro-esophageal reflux disease without esophagitis: Secondary | ICD-10-CM | POA: Diagnosis not present

## 2016-05-03 DIAGNOSIS — E139 Other specified diabetes mellitus without complications: Secondary | ICD-10-CM | POA: Diagnosis not present

## 2016-05-03 DIAGNOSIS — E1165 Type 2 diabetes mellitus with hyperglycemia: Secondary | ICD-10-CM | POA: Diagnosis not present

## 2016-05-03 DIAGNOSIS — D649 Anemia, unspecified: Secondary | ICD-10-CM | POA: Diagnosis not present

## 2016-05-03 DIAGNOSIS — I10 Essential (primary) hypertension: Secondary | ICD-10-CM | POA: Diagnosis not present

## 2016-05-03 DIAGNOSIS — R799 Abnormal finding of blood chemistry, unspecified: Secondary | ICD-10-CM | POA: Diagnosis not present

## 2016-05-03 DIAGNOSIS — E785 Hyperlipidemia, unspecified: Secondary | ICD-10-CM | POA: Diagnosis not present

## 2016-05-03 DIAGNOSIS — F329 Major depressive disorder, single episode, unspecified: Secondary | ICD-10-CM | POA: Diagnosis not present

## 2016-05-03 DIAGNOSIS — K219 Gastro-esophageal reflux disease without esophagitis: Secondary | ICD-10-CM | POA: Diagnosis not present

## 2016-05-03 DIAGNOSIS — R21 Rash and other nonspecific skin eruption: Secondary | ICD-10-CM | POA: Diagnosis not present

## 2016-05-04 DIAGNOSIS — L300 Nummular dermatitis: Secondary | ICD-10-CM | POA: Diagnosis not present

## 2016-05-04 DIAGNOSIS — L299 Pruritus, unspecified: Secondary | ICD-10-CM | POA: Diagnosis not present

## 2016-08-07 DIAGNOSIS — G608 Other hereditary and idiopathic neuropathies: Secondary | ICD-10-CM | POA: Diagnosis not present

## 2016-08-07 DIAGNOSIS — F329 Major depressive disorder, single episode, unspecified: Secondary | ICD-10-CM | POA: Diagnosis not present

## 2016-08-07 DIAGNOSIS — R799 Abnormal finding of blood chemistry, unspecified: Secondary | ICD-10-CM | POA: Diagnosis not present

## 2016-08-07 DIAGNOSIS — Z89612 Acquired absence of left leg above knee: Secondary | ICD-10-CM | POA: Diagnosis not present

## 2016-08-07 DIAGNOSIS — E785 Hyperlipidemia, unspecified: Secondary | ICD-10-CM | POA: Diagnosis not present

## 2016-08-07 DIAGNOSIS — K219 Gastro-esophageal reflux disease without esophagitis: Secondary | ICD-10-CM | POA: Diagnosis not present

## 2016-08-07 DIAGNOSIS — E139 Other specified diabetes mellitus without complications: Secondary | ICD-10-CM | POA: Diagnosis not present

## 2016-08-07 DIAGNOSIS — D649 Anemia, unspecified: Secondary | ICD-10-CM | POA: Diagnosis not present

## 2016-08-07 DIAGNOSIS — I10 Essential (primary) hypertension: Secondary | ICD-10-CM | POA: Diagnosis not present

## 2016-08-07 DIAGNOSIS — L409 Psoriasis, unspecified: Secondary | ICD-10-CM | POA: Diagnosis not present

## 2016-08-07 DIAGNOSIS — D518 Other vitamin B12 deficiency anemias: Secondary | ICD-10-CM | POA: Diagnosis not present

## 2016-08-07 DIAGNOSIS — E1165 Type 2 diabetes mellitus with hyperglycemia: Secondary | ICD-10-CM | POA: Diagnosis not present

## 2016-08-07 DIAGNOSIS — D509 Iron deficiency anemia, unspecified: Secondary | ICD-10-CM | POA: Diagnosis not present

## 2016-10-25 DIAGNOSIS — S61216A Laceration without foreign body of right little finger without damage to nail, initial encounter: Secondary | ICD-10-CM | POA: Diagnosis not present

## 2016-12-25 DIAGNOSIS — H6692 Otitis media, unspecified, left ear: Secondary | ICD-10-CM | POA: Diagnosis not present

## 2016-12-25 DIAGNOSIS — J069 Acute upper respiratory infection, unspecified: Secondary | ICD-10-CM | POA: Diagnosis not present

## 2017-07-31 DIAGNOSIS — L03115 Cellulitis of right lower limb: Secondary | ICD-10-CM | POA: Diagnosis not present

## 2017-07-31 DIAGNOSIS — R609 Edema, unspecified: Secondary | ICD-10-CM | POA: Diagnosis not present

## 2017-08-01 DIAGNOSIS — M7989 Other specified soft tissue disorders: Secondary | ICD-10-CM | POA: Diagnosis not present

## 2017-08-01 DIAGNOSIS — M79604 Pain in right leg: Secondary | ICD-10-CM | POA: Diagnosis not present

## 2020-10-15 ENCOUNTER — Inpatient Hospital Stay (HOSPITAL_COMMUNITY)
Admission: EM | Admit: 2020-10-15 | Discharge: 2020-10-17 | DRG: 562 | Disposition: A | Discharge status: Home Health | Payer: Medicare Other | Attending: Surgery | Admitting: Surgery

## 2020-10-15 ENCOUNTER — Emergency Department (HOSPITAL_COMMUNITY): Payer: Medicare Other

## 2020-10-15 ENCOUNTER — Encounter (HOSPITAL_COMMUNITY): Payer: Self-pay | Admitting: Physician Assistant

## 2020-10-15 ENCOUNTER — Other Ambulatory Visit: Payer: Self-pay

## 2020-10-15 DIAGNOSIS — B182 Chronic viral hepatitis C: Secondary | ICD-10-CM | POA: Diagnosis present

## 2020-10-15 DIAGNOSIS — Z89612 Acquired absence of left leg above knee: Secondary | ICD-10-CM | POA: Diagnosis not present

## 2020-10-15 DIAGNOSIS — I129 Hypertensive chronic kidney disease with stage 1 through stage 4 chronic kidney disease, or unspecified chronic kidney disease: Secondary | ICD-10-CM | POA: Diagnosis present

## 2020-10-15 DIAGNOSIS — U071 COVID-19: Secondary | ICD-10-CM | POA: Diagnosis present

## 2020-10-15 DIAGNOSIS — S52612A Displaced fracture of left ulna styloid process, initial encounter for closed fracture: Secondary | ICD-10-CM | POA: Diagnosis present

## 2020-10-15 DIAGNOSIS — M7989 Other specified soft tissue disorders: Secondary | ICD-10-CM | POA: Diagnosis not present

## 2020-10-15 DIAGNOSIS — M25532 Pain in left wrist: Secondary | ICD-10-CM | POA: Diagnosis present

## 2020-10-15 DIAGNOSIS — Z96641 Presence of right artificial hip joint: Secondary | ICD-10-CM | POA: Diagnosis present

## 2020-10-15 DIAGNOSIS — E1165 Type 2 diabetes mellitus with hyperglycemia: Secondary | ICD-10-CM | POA: Diagnosis present

## 2020-10-15 DIAGNOSIS — S52592A Other fractures of lower end of left radius, initial encounter for closed fracture: Secondary | ICD-10-CM | POA: Diagnosis present

## 2020-10-15 DIAGNOSIS — S0101XA Laceration without foreign body of scalp, initial encounter: Secondary | ICD-10-CM | POA: Diagnosis present

## 2020-10-15 DIAGNOSIS — R079 Chest pain, unspecified: Secondary | ICD-10-CM

## 2020-10-15 DIAGNOSIS — Z9049 Acquired absence of other specified parts of digestive tract: Secondary | ICD-10-CM

## 2020-10-15 DIAGNOSIS — E1122 Type 2 diabetes mellitus with diabetic chronic kidney disease: Secondary | ICD-10-CM | POA: Diagnosis present

## 2020-10-15 DIAGNOSIS — Z23 Encounter for immunization: Secondary | ICD-10-CM | POA: Diagnosis present

## 2020-10-15 DIAGNOSIS — S62109A Fracture of unspecified carpal bone, unspecified wrist, initial encounter for closed fracture: Secondary | ICD-10-CM | POA: Diagnosis present

## 2020-10-15 DIAGNOSIS — Z28310 Unvaccinated for covid-19: Secondary | ICD-10-CM | POA: Diagnosis not present

## 2020-10-15 DIAGNOSIS — T1490XA Injury, unspecified, initial encounter: Secondary | ICD-10-CM

## 2020-10-15 DIAGNOSIS — S20212A Contusion of left front wall of thorax, initial encounter: Secondary | ICD-10-CM | POA: Diagnosis present

## 2020-10-15 DIAGNOSIS — N189 Chronic kidney disease, unspecified: Secondary | ICD-10-CM | POA: Diagnosis present

## 2020-10-15 DIAGNOSIS — F1510 Other stimulant abuse, uncomplicated: Secondary | ICD-10-CM | POA: Diagnosis present

## 2020-10-15 DIAGNOSIS — S0181XA Laceration without foreign body of other part of head, initial encounter: Secondary | ICD-10-CM | POA: Diagnosis present

## 2020-10-15 DIAGNOSIS — Y92410 Unspecified street and highway as the place of occurrence of the external cause: Secondary | ICD-10-CM | POA: Diagnosis not present

## 2020-10-15 DIAGNOSIS — J439 Emphysema, unspecified: Secondary | ICD-10-CM | POA: Diagnosis present

## 2020-10-15 DIAGNOSIS — S7012XA Contusion of left thigh, initial encounter: Secondary | ICD-10-CM | POA: Diagnosis present

## 2020-10-15 HISTORY — DX: Chronic viral hepatitis C: B18.2

## 2020-10-15 HISTORY — DX: Other psychoactive substance abuse, uncomplicated: F19.10

## 2020-10-15 HISTORY — DX: Chronic obstructive pulmonary disease, unspecified: J44.9

## 2020-10-15 HISTORY — DX: Type 2 diabetes mellitus without complications: E11.9

## 2020-10-15 HISTORY — DX: Chronic kidney disease, unspecified: N18.9

## 2020-10-15 LAB — COMPREHENSIVE METABOLIC PANEL
ALT: 18 U/L (ref 0–44)
AST: 24 U/L (ref 15–41)
Albumin: 3.1 g/dL — ABNORMAL LOW (ref 3.5–5.0)
Alkaline Phosphatase: 67 U/L (ref 38–126)
Anion gap: 8 (ref 5–15)
BUN: 12 mg/dL (ref 6–20)
CO2: 27 mmol/L (ref 22–32)
Calcium: 8.9 mg/dL (ref 8.9–10.3)
Chloride: 98 mmol/L (ref 98–111)
Creatinine, Ser: 1.34 mg/dL — ABNORMAL HIGH (ref 0.61–1.24)
GFR, Estimated: >60 mL/min (ref >60)
Glucose, Bld: 228 mg/dL — ABNORMAL HIGH (ref 70–99)
Potassium: 3.4 mmol/L — ABNORMAL LOW (ref 3.5–5.1)
Sodium: 133 mmol/L — ABNORMAL LOW (ref 135–145)
Total Bilirubin: 1 mg/dL (ref 0.3–1.2)
Total Protein: 6.9 g/dL (ref 6.5–8.1)

## 2020-10-15 LAB — URINALYSIS, ROUTINE W REFLEX MICROSCOPIC
Bacteria, UA: NONE SEEN
Bilirubin Urine: NEGATIVE
Glucose, UA: 150 mg/dL — AB
Ketones, ur: NEGATIVE mg/dL
Leukocytes,Ua: NEGATIVE
Nitrite: NEGATIVE
Protein, ur: 100 mg/dL — AB
Specific Gravity, Urine: 1.044 — ABNORMAL HIGH (ref 1.005–1.030)
pH: 5 (ref 5.0–8.0)

## 2020-10-15 LAB — RAPID URINE DRUG SCREEN, HOSP PERFORMED
Amphetamines: POSITIVE — AB
Barbiturates: NOT DETECTED
Benzodiazepines: NOT DETECTED
Cocaine: NOT DETECTED
Opiates: NOT DETECTED
Tetrahydrocannabinol: NOT DETECTED

## 2020-10-15 LAB — I-STAT CHEM 8, ED
BUN: 15 mg/dL (ref 6–20)
Calcium, Ion: 1.09 mmol/L — ABNORMAL LOW (ref 1.15–1.40)
Chloride: 96 mmol/L — ABNORMAL LOW (ref 98–111)
Creatinine, Ser: 1.3 mg/dL — ABNORMAL HIGH (ref 0.61–1.24)
Glucose, Bld: 225 mg/dL — ABNORMAL HIGH (ref 70–99)
HCT: 47 % (ref 39.0–52.0)
Hemoglobin: 16 g/dL (ref 13.0–17.0)
Potassium: 3.5 mmol/L (ref 3.5–5.1)
Sodium: 135 mmol/L (ref 135–145)
TCO2: 30 mmol/L (ref 22–32)

## 2020-10-15 LAB — CBC
HCT: 47 % (ref 39.0–52.0)
Hemoglobin: 15.4 g/dL (ref 13.0–17.0)
MCH: 28 pg (ref 26.0–34.0)
MCHC: 32.8 g/dL (ref 30.0–36.0)
MCV: 85.5 fL (ref 80.0–100.0)
Platelets: 363 10*3/uL (ref 150–400)
RBC: 5.5 MIL/uL (ref 4.22–5.81)
RDW: 12.8 % (ref 11.5–15.5)
WBC: 12.1 10*3/uL — ABNORMAL HIGH (ref 4.0–10.5)
nRBC: 0 % (ref 0.0–0.2)

## 2020-10-15 LAB — ETHANOL: Alcohol, Ethyl (B): <10 mg/dL (ref <10)

## 2020-10-15 LAB — PROTIME-INR
INR: 1 (ref 0.8–1.2)
Prothrombin Time: 13.1 seconds (ref 11.4–15.2)

## 2020-10-15 LAB — SAMPLE TO BLOOD BANK

## 2020-10-15 LAB — CBG MONITORING, ED: Glucose-Capillary: 187 mg/dL — ABNORMAL HIGH (ref 70–99)

## 2020-10-15 LAB — RESP PANEL BY RT-PCR (FLU A&B, COVID) ARPGX2
Influenza A by PCR: NEGATIVE
Influenza B by PCR: NEGATIVE
SARS Coronavirus 2 by RT PCR: POSITIVE — AB

## 2020-10-15 LAB — LACTIC ACID, PLASMA: Lactic Acid, Venous: 1.8 mmol/L (ref 0.5–1.9)

## 2020-10-15 MED ORDER — IOHEXOL 350 MG/ML SOLN
100.0000 mL | Freq: Once | INTRAVENOUS | Status: AC | PRN
  Administered 2020-10-15: 100 mL via INTRAVENOUS

## 2020-10-15 MED ORDER — CEFAZOLIN SODIUM-DEXTROSE 2-4 GM/100ML-% IV SOLN
2.0000 g | Freq: Once | INTRAVENOUS | Status: AC
  Administered 2020-10-15: 2 g via INTRAVENOUS
  Filled 2020-10-15: qty 100

## 2020-10-15 MED ORDER — ACETAMINOPHEN 500 MG PO TABS
1000.0000 mg | ORAL_TABLET | Freq: Four times a day (QID) | ORAL | Status: DC
  Administered 2020-10-15 – 2020-10-17 (×6): 1000 mg via ORAL
  Filled 2020-10-15 (×7): qty 2

## 2020-10-15 MED ORDER — FENTANYL CITRATE PF 50 MCG/ML IJ SOSY
50.0000 ug | PREFILLED_SYRINGE | Freq: Once | INTRAMUSCULAR | Status: AC
Start: 2020-10-15 — End: 2020-10-15
  Administered 2020-10-15: 50 ug via INTRAVENOUS
  Filled 2020-10-15: qty 1

## 2020-10-15 MED ORDER — LACTATED RINGERS IV SOLN: INTRAVENOUS | Status: DC

## 2020-10-15 MED ORDER — OXYCODONE HCL 5 MG PO TABS
5.0000 mg | ORAL_TABLET | ORAL | Status: DC | PRN
  Administered 2020-10-15: 5 mg via ORAL
  Administered 2020-10-16 – 2020-10-17 (×6): 10 mg via ORAL
  Filled 2020-10-15 (×4): qty 2
  Filled 2020-10-15: qty 1
  Filled 2020-10-15 (×2): qty 2

## 2020-10-15 MED ORDER — TETANUS-DIPHTH-ACELL PERTUSSIS 5-2.5-18.5 LF-MCG/0.5 IM SUSY
0.5000 mL | PREFILLED_SYRINGE | Freq: Once | INTRAMUSCULAR | Status: AC
  Administered 2020-10-15: 0.5 mL via INTRAMUSCULAR
  Filled 2020-10-15: qty 0.5

## 2020-10-15 MED ORDER — ENOXAPARIN SODIUM 30 MG/0.3ML IJ SOSY
30.0000 mg | PREFILLED_SYRINGE | Freq: Two times a day (BID) | INTRAMUSCULAR | Status: DC
  Administered 2020-10-16 – 2020-10-17 (×3): 30 mg via SUBCUTANEOUS
  Filled 2020-10-15 (×3): qty 0.3

## 2020-10-15 MED ORDER — LIDOCAINE-EPINEPHRINE (PF) 2 %-1:200000 IJ SOLN
40.0000 mL | Freq: Once | INTRAMUSCULAR | Status: AC
  Administered 2020-10-15: 40 mL
  Filled 2020-10-15: qty 40

## 2020-10-15 MED ORDER — ONDANSETRON HCL 4 MG/2ML IJ SOLN: 4.0000 mg | Freq: Four times a day (QID) | INTRAMUSCULAR | Status: DC | PRN

## 2020-10-15 MED ORDER — MORPHINE SULFATE (PF) 4 MG/ML IV SOLN: 4.0000 mg | INTRAVENOUS | Status: DC | PRN

## 2020-10-15 MED ORDER — ONDANSETRON 4 MG PO TBDP: 4.0000 mg | ORAL_TABLET | Freq: Four times a day (QID) | ORAL | Status: DC | PRN

## 2020-10-15 MED ORDER — METHOCARBAMOL 500 MG PO TABS
1000.0000 mg | ORAL_TABLET | Freq: Three times a day (TID) | ORAL | Status: DC
  Administered 2020-10-15 – 2020-10-17 (×5): 1000 mg via ORAL
  Filled 2020-10-15 (×5): qty 2

## 2020-10-15 MED ORDER — DOCUSATE SODIUM 100 MG PO CAPS
100.0000 mg | ORAL_CAPSULE | Freq: Two times a day (BID) | ORAL | Status: DC
  Administered 2020-10-16 – 2020-10-17 (×3): 100 mg via ORAL
  Filled 2020-10-15 (×3): qty 1
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
## 2020-10-15 NOTE — Progress Notes (Signed)
   10/15/20 1600  Clinical Encounter Type  Visited With Patient  Visit Type Initial  Referral From Nurse  Consult/Referral To Chaplain  Spiritual Encounters  Spiritual Needs Emotional  Stress Factors  Patient Stress Factors None identified  Family Stress Factors None identified  Chaplain met patient as he entered [place].  Alert but not conversational with Chaplain.  No emotional or spiritual distress exhibited.  No family present.  Will remain available for assistance as needed.

## 2020-10-15 NOTE — Progress Notes (Signed)
Orthopedic Tech Progress Note Patient Details:  Mario Atkins 08-15-1960 [id number]  Ortho Devices Type of Ortho Device: Arm sling, [place] splint Ortho Device/Splint Location: Left arm Ortho Device/Splint Interventions: Application   Post Interventions Patient Tolerated: Well  Mario Atkins Mario Atkins 10/15/2020, 6:43 PM

## 2020-10-15 NOTE — ED Notes (Signed)
Mario Atkins [facility]) called asking for an update [phone]
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
## 2020-10-15 NOTE — ED Notes (Signed)
Rn and friend of family Danae Chen [phone] is bringing food and family would like an update
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
## 2020-10-16 ENCOUNTER — Encounter (HOSPITAL_COMMUNITY): Payer: Self-pay

## 2020-10-16 LAB — CBC
HCT: 43.8 % (ref 39.0–52.0)
HCT: 44.5 % (ref 39.0–52.0)
Hemoglobin: 14.7 g/dL (ref 13.0–17.0)
Hemoglobin: 14.9 g/dL (ref 13.0–17.0)
MCH: 28.5 pg (ref 26.0–34.0)
MCH: 28.7 pg (ref 26.0–34.0)
MCHC: 33.5 g/dL (ref 30.0–36.0)
MCHC: 33.6 g/dL (ref 30.0–36.0)
MCV: 85.1 fL (ref 80.0–100.0)
MCV: 85.4 fL (ref 80.0–100.0)
Platelets: 324 10*3/uL (ref 150–400)
Platelets: 327 10*3/uL (ref 150–400)
RBC: 5.13 MIL/uL (ref 4.22–5.81)
RBC: 5.23 MIL/uL (ref 4.22–5.81)
RDW: 13 % (ref 11.5–15.5)
RDW: 13 % (ref 11.5–15.5)
WBC: 11.8 10*3/uL — ABNORMAL HIGH (ref 4.0–10.5)
WBC: 13.2 10*3/uL — ABNORMAL HIGH (ref 4.0–10.5)
nRBC: 0 % (ref 0.0–0.2)
nRBC: 0 % (ref 0.0–0.2)

## 2020-10-16 LAB — BASIC METABOLIC PANEL
Anion gap: 8 (ref 5–15)
BUN: 12 mg/dL (ref 6–20)
CO2: 28 mmol/L (ref 22–32)
Calcium: 8.4 mg/dL — ABNORMAL LOW (ref 8.9–10.3)
Chloride: 97 mmol/L — ABNORMAL LOW (ref 98–111)
Creatinine, Ser: 1.43 mg/dL — ABNORMAL HIGH (ref 0.61–1.24)
GFR, Estimated: 56 mL/min — ABNORMAL LOW (ref >60)
Glucose, Bld: 182 mg/dL — ABNORMAL HIGH (ref 70–99)
Potassium: 4 mmol/L (ref 3.5–5.1)
Sodium: 133 mmol/L — ABNORMAL LOW (ref 135–145)

## 2020-10-16 LAB — CBG MONITORING, ED: Glucose-Capillary: 185 mg/dL — ABNORMAL HIGH (ref 70–99)

## 2020-10-16 LAB — HIV ANTIBODY (ROUTINE TESTING W REFLEX): HIV Screen 4th Generation wRfx: NONREACTIVE

## 2020-10-16 MED ORDER — EPINEPHRINE 0.3 MG/0.3ML IJ SOAJ
0.3000 mg | Freq: Once | INTRAMUSCULAR | Status: AC | PRN
Start: 2020-10-16 — End: 2020-10-17
  Filled 2020-10-16 (×2): qty 0.6

## 2020-10-16 MED ORDER — METHYLPREDNISOLONE SODIUM SUCC 125 MG IJ SOLR
125.0000 mg | Freq: Once | INTRAMUSCULAR | Status: AC | PRN
  Filled 2020-10-16: qty 2

## 2020-10-16 MED ORDER — BEBTELOVIMAB 175 MG/2 ML IV (EUA)
175.0000 mg | Freq: Once | INTRAMUSCULAR | Status: DC
  Filled 2020-10-16: qty 2

## 2020-10-16 MED ORDER — DIPHENHYDRAMINE HCL 50 MG/ML IJ SOLN: 50.0000 mg | Freq: Once | INTRAMUSCULAR | Status: AC | PRN

## 2020-10-16 MED ORDER — SODIUM CHLORIDE 0.9 % IV SOLN: INTRAVENOUS | Status: AC | PRN

## 2020-10-16 MED ORDER — ALBUTEROL SULFATE HFA 108 (90 BASE) MCG/ACT IN AERS
2.0000 | INHALATION_SPRAY | Freq: Once | RESPIRATORY_TRACT | Status: AC | PRN
  Filled 2020-10-16: qty 6.7

## 2020-10-16 MED ORDER — FAMOTIDINE 20 MG IN NS 100 ML IVPB
20.0000 mg | Freq: Once | INTRAVENOUS | Status: AC | PRN
  Filled 2020-10-16: qty 100

## 2020-10-16 NOTE — ED Notes (Signed)
Pt noted to be satting 85% on room air, placed on 3L with improvement of sats to 91%

## 2020-10-16 NOTE — Progress Notes (Signed)
1313: Patient RLE red and swollen; pt states it has been this way for months. Paged Trauma to update; awaiting call back at this time.  1316: Trauma called this RN and discussed. Stated will order [place] to be done tomm to rule out DVT.

## 2020-10-16 NOTE — ED Notes (Signed)
Report called to 5N RN 
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
## 2020-10-16 NOTE — ED Notes (Signed)
Beth pt Fiance called again for an update [phone]

## 2020-10-16 NOTE — ED Notes (Signed)
Updated Beth on plan of care
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
## 2020-10-17 ENCOUNTER — Other Ambulatory Visit (HOSPITAL_COMMUNITY): Payer: Self-pay

## 2020-10-17 ENCOUNTER — Inpatient Hospital Stay (HOSPITAL_COMMUNITY): Payer: Medicare Other

## 2020-10-17 DIAGNOSIS — M7989 Other specified soft tissue disorders: Secondary | ICD-10-CM

## 2020-10-17 LAB — CBC
HCT: 40 % (ref 39.0–52.0)
Hemoglobin: 13.3 g/dL (ref 13.0–17.0)
MCH: 28.4 pg (ref 26.0–34.0)
MCHC: 33.3 g/dL (ref 30.0–36.0)
MCV: 85.5 fL (ref 80.0–100.0)
Platelets: 284 10*3/uL (ref 150–400)
RBC: 4.68 MIL/uL (ref 4.22–5.81)
RDW: 13.1 % (ref 11.5–15.5)
WBC: 6.8 10*3/uL (ref 4.0–10.5)
nRBC: 0 % (ref 0.0–0.2)

## 2020-10-17 LAB — BASIC METABOLIC PANEL
Anion gap: 8 (ref 5–15)
BUN: 15 mg/dL (ref 6–20)
CO2: 27 mmol/L (ref 22–32)
Calcium: 8.2 mg/dL — ABNORMAL LOW (ref 8.9–10.3)
Chloride: 94 mmol/L — ABNORMAL LOW (ref 98–111)
Creatinine, Ser: 1.35 mg/dL — ABNORMAL HIGH (ref 0.61–1.24)
GFR, Estimated: >60 mL/min (ref >60)
Glucose, Bld: 271 mg/dL — ABNORMAL HIGH (ref 70–99)
Potassium: 3.6 mmol/L (ref 3.5–5.1)
Sodium: 129 mmol/L — ABNORMAL LOW (ref 135–145)

## 2020-10-17 LAB — GLUCOSE, CAPILLARY
Glucose-Capillary: 233 mg/dL — ABNORMAL HIGH (ref 70–99)
Glucose-Capillary: 254 mg/dL — ABNORMAL HIGH (ref 70–99)
Glucose-Capillary: 238 mg/dL — ABNORMAL HIGH (ref 70–99)

## 2020-10-17 LAB — HEMOGLOBIN A1C
Hgb A1c MFr Bld: 9.1 % — ABNORMAL HIGH (ref 4.8–5.6)
Mean Plasma Glucose: 214.47 mg/dL

## 2020-10-17 MED ORDER — DIPHENHYDRAMINE HCL 50 MG/ML IJ SOLN: 25.0000 mg | Freq: Four times a day (QID) | INTRAMUSCULAR | Status: DC | PRN

## 2020-10-17 MED ORDER — INSULIN GLARGINE-YFGN 100 UNIT/ML ~~LOC~~ SOLN
25.0000 [IU] | Freq: Every evening | SUBCUTANEOUS | Status: DC
  Filled 2020-10-17: qty 0.25

## 2020-10-17 MED ORDER — INSULIN PEN NEEDLE 32G X 4 MM MISC
0 refills | Status: AC
  Filled 2020-10-17: qty 100, 90d supply, fill #0

## 2020-10-17 MED ORDER — METFORMIN HCL ER 750 MG PO TB24
750.0000 mg | ORAL_TABLET | Freq: Every day | ORAL | 0 refills | Status: AC
  Filled 2020-10-17: qty 30, 30d supply, fill #0

## 2020-10-17 MED ORDER — METOPROLOL SUCCINATE ER 25 MG PO TB24
25.0000 mg | ORAL_TABLET | Freq: Every day | ORAL | 0 refills | Status: AC
  Filled 2020-10-17: qty 30, 30d supply, fill #0

## 2020-10-17 MED ORDER — POLYETHYLENE GLYCOL 3350 17 G PO PACK
17.0000 g | PACK | Freq: Every day | ORAL | Status: DC
  Administered 2020-10-17: 17 g via ORAL
  Filled 2020-10-17: qty 1

## 2020-10-17 MED ORDER — INSULIN ASPART 100 UNIT/ML IJ SOLN
0.0000 [IU] | Freq: Three times a day (TID) | INTRAMUSCULAR | Status: DC
  Administered 2020-10-17: 8 [IU] via SUBCUTANEOUS
  Administered 2020-10-17: 5 [IU] via SUBCUTANEOUS

## 2020-10-17 MED ORDER — INSULIN ASPART 100 UNIT/ML IJ SOLN: 0.0000 [IU] | Freq: Every day | INTRAMUSCULAR | Status: DC

## 2020-10-17 MED ORDER — METOPROLOL SUCCINATE ER 25 MG PO TB24
25.0000 mg | ORAL_TABLET | Freq: Every day | ORAL | Status: DC
  Administered 2020-10-17: 25 mg via ORAL
  Filled 2020-10-17: qty 1

## 2020-10-17 MED ORDER — ALBUTEROL SULFATE HFA 108 (90 BASE) MCG/ACT IN AERS
2.0000 | INHALATION_SPRAY | Freq: Four times a day (QID) | RESPIRATORY_TRACT | Status: DC | PRN
  Filled 2020-10-17: qty 6.7

## 2020-10-17 MED ORDER — LANTUS SOLOSTAR 100 UNIT/ML ~~LOC~~ SOPN
25.0000 [IU] | PEN_INJECTOR | Freq: Every evening | SUBCUTANEOUS | 0 refills | Status: AC
  Filled 2020-10-17: qty 9, 30d supply, fill #0

## 2020-10-17 MED ORDER — OXYCODONE HCL 5 MG PO TABS
5.0000 mg | ORAL_TABLET | Freq: Four times a day (QID) | ORAL | 0 refills | Status: AC | PRN
  Filled 2020-10-17: qty 10, 3d supply, fill #0

## 2020-10-17 MED ORDER — LISINOPRIL 5 MG PO TABS
5.0000 mg | ORAL_TABLET | Freq: Every day | ORAL | 0 refills | Status: AC
  Filled 2020-10-17: qty 30, 30d supply, fill #0

## 2020-10-17 MED ORDER — LISINOPRIL 5 MG PO TABS
5.0000 mg | ORAL_TABLET | Freq: Every day | ORAL | Status: DC
  Administered 2020-10-17: 5 mg via ORAL
  Filled 2020-10-17: qty 1

## 2020-10-17 MED ORDER — ACETAMINOPHEN 500 MG PO TABS: 1000.0000 mg | ORAL_TABLET | Freq: Four times a day (QID) | ORAL | 0 refills | Status: AC | PRN
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
## 2020-10-17 NOTE — Progress Notes (Signed)
Called vascular lab to ask about time for [place] pending d/c for patient. Left call ack number and stated they will call me back with an estimated time.
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
## 2020-10-17 NOTE — Progress Notes (Signed)
Reviewed AVS forms with patient and verbalized understanding. Patient has medications, DME, AVS forms and personal belongings. This RN and tech escorted pt to ride.
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
## 2020-10-17 NOTE — Progress Notes (Signed)
RLE venous duplex has been completed.   Results can be found under chart review under CV PROC. 10/17/2020 6:08 PM Mykeal Carrick RVT, RDMS
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
## 2021-03-03 DIAGNOSIS — S78112A Complete traumatic amputation at level between left hip and knee, initial encounter: Secondary | ICD-10-CM | POA: Diagnosis not present

## 2021-03-03 DIAGNOSIS — L03115 Cellulitis of right lower limb: Secondary | ICD-10-CM | POA: Diagnosis not present

## 2021-03-10 DIAGNOSIS — E1165 Type 2 diabetes mellitus with hyperglycemia: Secondary | ICD-10-CM | POA: Diagnosis not present

## 2021-03-23 DIAGNOSIS — E1129 Type 2 diabetes mellitus with other diabetic kidney complication: Secondary | ICD-10-CM | POA: Diagnosis not present

## 2021-03-23 DIAGNOSIS — I10 Essential (primary) hypertension: Secondary | ICD-10-CM | POA: Diagnosis not present

## 2021-03-23 DIAGNOSIS — E1165 Type 2 diabetes mellitus with hyperglycemia: Secondary | ICD-10-CM | POA: Diagnosis not present

## 2021-03-23 DIAGNOSIS — Z0000 Encounter for general adult medical examination without abnormal findings: Secondary | ICD-10-CM | POA: Diagnosis not present

## 2021-03-23 DIAGNOSIS — Z139 Encounter for screening, unspecified: Secondary | ICD-10-CM | POA: Diagnosis not present

## 2021-03-23 DIAGNOSIS — F17210 Nicotine dependence, cigarettes, uncomplicated: Secondary | ICD-10-CM | POA: Diagnosis not present

## 2021-03-23 DIAGNOSIS — R809 Proteinuria, unspecified: Secondary | ICD-10-CM | POA: Diagnosis not present

## 2021-03-23 DIAGNOSIS — Z720 Tobacco use: Secondary | ICD-10-CM | POA: Diagnosis not present

## 2021-05-04 DIAGNOSIS — J020 Streptococcal pharyngitis: Secondary | ICD-10-CM | POA: Diagnosis not present

## 2021-05-09 DIAGNOSIS — R0981 Nasal congestion: Secondary | ICD-10-CM | POA: Diagnosis not present

## 2021-05-09 DIAGNOSIS — J069 Acute upper respiratory infection, unspecified: Secondary | ICD-10-CM | POA: Diagnosis not present

## 2021-05-09 DIAGNOSIS — R0689 Other abnormalities of breathing: Secondary | ICD-10-CM | POA: Diagnosis not present

## 2021-05-09 DIAGNOSIS — Z20822 Contact with and (suspected) exposure to covid-19: Secondary | ICD-10-CM | POA: Diagnosis not present

## 2021-06-05 DIAGNOSIS — Z794 Long term (current) use of insulin: Secondary | ICD-10-CM | POA: Diagnosis not present

## 2021-06-05 DIAGNOSIS — Z89612 Acquired absence of left leg above knee: Secondary | ICD-10-CM | POA: Diagnosis not present

## 2021-06-05 DIAGNOSIS — F17210 Nicotine dependence, cigarettes, uncomplicated: Secondary | ICD-10-CM | POA: Diagnosis not present

## 2021-06-05 DIAGNOSIS — E1165 Type 2 diabetes mellitus with hyperglycemia: Secondary | ICD-10-CM | POA: Diagnosis not present

## 2021-06-05 DIAGNOSIS — R2689 Other abnormalities of gait and mobility: Secondary | ICD-10-CM | POA: Diagnosis not present

## 2021-09-05 DIAGNOSIS — L03115 Cellulitis of right lower limb: Secondary | ICD-10-CM | POA: Diagnosis not present

## 2021-09-05 DIAGNOSIS — I10 Essential (primary) hypertension: Secondary | ICD-10-CM | POA: Diagnosis not present

## 2021-09-05 DIAGNOSIS — E1129 Type 2 diabetes mellitus with other diabetic kidney complication: Secondary | ICD-10-CM | POA: Diagnosis not present

## 2021-09-05 DIAGNOSIS — R809 Proteinuria, unspecified: Secondary | ICD-10-CM | POA: Diagnosis not present

## 2021-09-06 DIAGNOSIS — L03115 Cellulitis of right lower limb: Secondary | ICD-10-CM | POA: Diagnosis not present

## 2021-10-06 DIAGNOSIS — E785 Hyperlipidemia, unspecified: Secondary | ICD-10-CM | POA: Diagnosis not present

## 2021-10-06 DIAGNOSIS — R600 Localized edema: Secondary | ICD-10-CM | POA: Diagnosis not present

## 2021-10-06 DIAGNOSIS — E1165 Type 2 diabetes mellitus with hyperglycemia: Secondary | ICD-10-CM | POA: Diagnosis not present

## 2021-10-13 DIAGNOSIS — E1129 Type 2 diabetes mellitus with other diabetic kidney complication: Secondary | ICD-10-CM | POA: Diagnosis not present

## 2021-10-13 DIAGNOSIS — I10 Essential (primary) hypertension: Secondary | ICD-10-CM | POA: Diagnosis not present

## 2021-10-13 DIAGNOSIS — E785 Hyperlipidemia, unspecified: Secondary | ICD-10-CM | POA: Diagnosis not present

## 2021-10-13 DIAGNOSIS — E1165 Type 2 diabetes mellitus with hyperglycemia: Secondary | ICD-10-CM | POA: Diagnosis not present

## 2021-10-13 DIAGNOSIS — R809 Proteinuria, unspecified: Secondary | ICD-10-CM | POA: Diagnosis not present

## 2021-10-19 DIAGNOSIS — Z89612 Acquired absence of left leg above knee: Secondary | ICD-10-CM | POA: Diagnosis not present

## 2022-02-08 DIAGNOSIS — L03115 Cellulitis of right lower limb: Secondary | ICD-10-CM | POA: Diagnosis not present

## 2022-02-08 DIAGNOSIS — R059 Cough, unspecified: Secondary | ICD-10-CM | POA: Diagnosis not present

## 2022-04-18 DIAGNOSIS — I10 Essential (primary) hypertension: Secondary | ICD-10-CM | POA: Diagnosis not present

## 2022-04-18 DIAGNOSIS — E1165 Type 2 diabetes mellitus with hyperglycemia: Secondary | ICD-10-CM | POA: Diagnosis not present

## 2022-04-18 DIAGNOSIS — E785 Hyperlipidemia, unspecified: Secondary | ICD-10-CM | POA: Diagnosis not present

## 2022-04-20 DIAGNOSIS — Z139 Encounter for screening, unspecified: Secondary | ICD-10-CM | POA: Diagnosis not present

## 2022-04-20 DIAGNOSIS — Z0000 Encounter for general adult medical examination without abnormal findings: Secondary | ICD-10-CM | POA: Diagnosis not present

## 2022-04-20 DIAGNOSIS — E785 Hyperlipidemia, unspecified: Secondary | ICD-10-CM | POA: Diagnosis not present

## 2022-04-20 DIAGNOSIS — E1140 Type 2 diabetes mellitus with diabetic neuropathy, unspecified: Secondary | ICD-10-CM | POA: Diagnosis not present

## 2022-04-20 DIAGNOSIS — Z1389 Encounter for screening for other disorder: Secondary | ICD-10-CM | POA: Diagnosis not present

## 2022-04-20 DIAGNOSIS — I10 Essential (primary) hypertension: Secondary | ICD-10-CM | POA: Diagnosis not present

## 2022-04-20 DIAGNOSIS — Z136 Encounter for screening for cardiovascular disorders: Secondary | ICD-10-CM | POA: Diagnosis not present

## 2022-04-20 DIAGNOSIS — Z86010 Personal history of colonic polyps: Secondary | ICD-10-CM | POA: Diagnosis not present

## 2022-05-10 DIAGNOSIS — E119 Type 2 diabetes mellitus without complications: Secondary | ICD-10-CM | POA: Diagnosis not present

## 2022-05-10 DIAGNOSIS — B353 Tinea pedis: Secondary | ICD-10-CM | POA: Diagnosis not present

## 2022-05-29 DIAGNOSIS — E871 Hypo-osmolality and hyponatremia: Secondary | ICD-10-CM | POA: Diagnosis not present

## 2022-05-29 DIAGNOSIS — R600 Localized edema: Secondary | ICD-10-CM | POA: Diagnosis not present

## 2022-05-29 DIAGNOSIS — L98499 Non-pressure chronic ulcer of skin of other sites with unspecified severity: Secondary | ICD-10-CM | POA: Diagnosis not present

## 2022-05-29 DIAGNOSIS — F17210 Nicotine dependence, cigarettes, uncomplicated: Secondary | ICD-10-CM | POA: Diagnosis not present

## 2022-05-29 DIAGNOSIS — J9601 Acute respiratory failure with hypoxia: Secondary | ICD-10-CM | POA: Diagnosis not present

## 2022-05-29 DIAGNOSIS — Z6828 Body mass index (BMI) 28.0-28.9, adult: Secondary | ICD-10-CM | POA: Diagnosis not present

## 2022-05-29 DIAGNOSIS — E1165 Type 2 diabetes mellitus with hyperglycemia: Secondary | ICD-10-CM | POA: Diagnosis not present

## 2022-05-29 DIAGNOSIS — L02413 Cutaneous abscess of right upper limb: Secondary | ICD-10-CM | POA: Diagnosis not present

## 2022-05-29 DIAGNOSIS — A400 Sepsis due to streptococcus, group A: Secondary | ICD-10-CM | POA: Diagnosis not present

## 2022-05-29 DIAGNOSIS — J449 Chronic obstructive pulmonary disease, unspecified: Secondary | ICD-10-CM | POA: Diagnosis not present

## 2022-05-29 DIAGNOSIS — E7800 Pure hypercholesterolemia, unspecified: Secondary | ICD-10-CM | POA: Diagnosis not present

## 2022-05-29 DIAGNOSIS — I10 Essential (primary) hypertension: Secondary | ICD-10-CM | POA: Diagnosis not present

## 2022-05-29 DIAGNOSIS — L03119 Cellulitis of unspecified part of limb: Secondary | ICD-10-CM | POA: Diagnosis not present

## 2022-05-29 DIAGNOSIS — L03113 Cellulitis of right upper limb: Secondary | ICD-10-CM | POA: Diagnosis not present

## 2022-05-29 DIAGNOSIS — A419 Sepsis, unspecified organism: Secondary | ICD-10-CM | POA: Diagnosis not present

## 2022-05-29 DIAGNOSIS — E119 Type 2 diabetes mellitus without complications: Secondary | ICD-10-CM | POA: Diagnosis not present

## 2022-05-30 DIAGNOSIS — L03119 Cellulitis of unspecified part of limb: Secondary | ICD-10-CM | POA: Diagnosis not present

## 2022-05-30 DIAGNOSIS — F17210 Nicotine dependence, cigarettes, uncomplicated: Secondary | ICD-10-CM | POA: Diagnosis not present

## 2022-05-30 DIAGNOSIS — L02413 Cutaneous abscess of right upper limb: Secondary | ICD-10-CM | POA: Diagnosis not present

## 2022-05-30 DIAGNOSIS — A419 Sepsis, unspecified organism: Secondary | ICD-10-CM | POA: Diagnosis not present

## 2022-05-31 DIAGNOSIS — L03119 Cellulitis of unspecified part of limb: Secondary | ICD-10-CM | POA: Diagnosis not present

## 2022-05-31 DIAGNOSIS — A419 Sepsis, unspecified organism: Secondary | ICD-10-CM | POA: Diagnosis not present

## 2022-05-31 DIAGNOSIS — L02413 Cutaneous abscess of right upper limb: Secondary | ICD-10-CM | POA: Diagnosis not present

## 2022-06-01 DIAGNOSIS — A419 Sepsis, unspecified organism: Secondary | ICD-10-CM | POA: Diagnosis not present

## 2022-06-01 DIAGNOSIS — L02413 Cutaneous abscess of right upper limb: Secondary | ICD-10-CM | POA: Diagnosis not present

## 2022-06-01 DIAGNOSIS — L03119 Cellulitis of unspecified part of limb: Secondary | ICD-10-CM | POA: Diagnosis not present

## 2022-06-02 DIAGNOSIS — A419 Sepsis, unspecified organism: Secondary | ICD-10-CM | POA: Diagnosis not present

## 2022-06-02 DIAGNOSIS — L02413 Cutaneous abscess of right upper limb: Secondary | ICD-10-CM | POA: Diagnosis not present

## 2022-06-02 DIAGNOSIS — L03119 Cellulitis of unspecified part of limb: Secondary | ICD-10-CM | POA: Diagnosis not present

## 2022-06-06 DIAGNOSIS — L02413 Cutaneous abscess of right upper limb: Secondary | ICD-10-CM | POA: Diagnosis not present

## 2022-06-06 DIAGNOSIS — L03113 Cellulitis of right upper limb: Secondary | ICD-10-CM | POA: Diagnosis not present

## 2022-06-06 DIAGNOSIS — Z7689 Persons encountering health services in other specified circumstances: Secondary | ICD-10-CM | POA: Diagnosis not present

## 2022-06-06 DIAGNOSIS — Z6828 Body mass index (BMI) 28.0-28.9, adult: Secondary | ICD-10-CM | POA: Diagnosis not present

## 2022-06-08 DIAGNOSIS — Z6828 Body mass index (BMI) 28.0-28.9, adult: Secondary | ICD-10-CM | POA: Diagnosis not present

## 2022-06-08 DIAGNOSIS — L03113 Cellulitis of right upper limb: Secondary | ICD-10-CM | POA: Diagnosis not present

## 2022-06-08 DIAGNOSIS — L02413 Cutaneous abscess of right upper limb: Secondary | ICD-10-CM | POA: Diagnosis not present

## 2022-06-08 DIAGNOSIS — I10 Essential (primary) hypertension: Secondary | ICD-10-CM | POA: Diagnosis not present

## 2022-06-11 DIAGNOSIS — L02413 Cutaneous abscess of right upper limb: Secondary | ICD-10-CM | POA: Diagnosis not present

## 2022-06-11 DIAGNOSIS — I10 Essential (primary) hypertension: Secondary | ICD-10-CM | POA: Diagnosis not present

## 2022-06-11 DIAGNOSIS — Z6829 Body mass index (BMI) 29.0-29.9, adult: Secondary | ICD-10-CM | POA: Diagnosis not present

## 2022-06-14 DIAGNOSIS — L02413 Cutaneous abscess of right upper limb: Secondary | ICD-10-CM | POA: Diagnosis not present

## 2022-06-14 DIAGNOSIS — Z6829 Body mass index (BMI) 29.0-29.9, adult: Secondary | ICD-10-CM | POA: Diagnosis not present

## 2022-06-14 DIAGNOSIS — R3915 Urgency of urination: Secondary | ICD-10-CM | POA: Diagnosis not present

## 2022-06-19 DIAGNOSIS — Z6829 Body mass index (BMI) 29.0-29.9, adult: Secondary | ICD-10-CM | POA: Diagnosis not present

## 2022-06-19 DIAGNOSIS — L02413 Cutaneous abscess of right upper limb: Secondary | ICD-10-CM | POA: Diagnosis not present

## 2022-06-19 DIAGNOSIS — L03113 Cellulitis of right upper limb: Secondary | ICD-10-CM | POA: Diagnosis not present

## 2022-06-21 ENCOUNTER — Other Ambulatory Visit: Payer: Self-pay

## 2022-06-21 DIAGNOSIS — Z6828 Body mass index (BMI) 28.0-28.9, adult: Secondary | ICD-10-CM | POA: Diagnosis not present

## 2022-06-21 DIAGNOSIS — L02413 Cutaneous abscess of right upper limb: Secondary | ICD-10-CM | POA: Diagnosis not present

## 2022-06-22 DIAGNOSIS — L02413 Cutaneous abscess of right upper limb: Secondary | ICD-10-CM | POA: Diagnosis not present

## 2022-06-22 DIAGNOSIS — Z6828 Body mass index (BMI) 28.0-28.9, adult: Secondary | ICD-10-CM | POA: Diagnosis not present

## 2022-06-25 DIAGNOSIS — Z6828 Body mass index (BMI) 28.0-28.9, adult: Secondary | ICD-10-CM | POA: Diagnosis not present

## 2022-06-25 DIAGNOSIS — L02413 Cutaneous abscess of right upper limb: Secondary | ICD-10-CM | POA: Diagnosis not present

## 2022-06-28 DIAGNOSIS — L02413 Cutaneous abscess of right upper limb: Secondary | ICD-10-CM | POA: Diagnosis not present

## 2022-06-28 DIAGNOSIS — Z6828 Body mass index (BMI) 28.0-28.9, adult: Secondary | ICD-10-CM | POA: Diagnosis not present

## 2022-08-06 DIAGNOSIS — I10 Essential (primary) hypertension: Secondary | ICD-10-CM | POA: Diagnosis not present

## 2022-08-06 DIAGNOSIS — E785 Hyperlipidemia, unspecified: Secondary | ICD-10-CM | POA: Diagnosis not present

## 2022-08-06 DIAGNOSIS — Z125 Encounter for screening for malignant neoplasm of prostate: Secondary | ICD-10-CM | POA: Diagnosis not present

## 2022-08-06 DIAGNOSIS — L03115 Cellulitis of right lower limb: Secondary | ICD-10-CM | POA: Diagnosis not present

## 2022-08-06 DIAGNOSIS — E1165 Type 2 diabetes mellitus with hyperglycemia: Secondary | ICD-10-CM | POA: Diagnosis not present

## 2022-09-18 DIAGNOSIS — E785 Hyperlipidemia, unspecified: Secondary | ICD-10-CM | POA: Diagnosis not present

## 2022-09-18 DIAGNOSIS — I10 Essential (primary) hypertension: Secondary | ICD-10-CM | POA: Diagnosis not present

## 2022-09-18 DIAGNOSIS — E1140 Type 2 diabetes mellitus with diabetic neuropathy, unspecified: Secondary | ICD-10-CM | POA: Diagnosis not present

## 2022-09-18 DIAGNOSIS — Z1211 Encounter for screening for malignant neoplasm of colon: Secondary | ICD-10-CM | POA: Diagnosis not present

## 2022-09-18 DIAGNOSIS — Z6826 Body mass index (BMI) 26.0-26.9, adult: Secondary | ICD-10-CM | POA: Diagnosis not present

## 2023-04-26 DIAGNOSIS — E1140 Type 2 diabetes mellitus with diabetic neuropathy, unspecified: Secondary | ICD-10-CM | POA: Diagnosis not present

## 2023-04-26 DIAGNOSIS — E1165 Type 2 diabetes mellitus with hyperglycemia: Secondary | ICD-10-CM | POA: Diagnosis not present

## 2023-04-26 DIAGNOSIS — I10 Essential (primary) hypertension: Secondary | ICD-10-CM | POA: Diagnosis not present

## 2023-04-26 DIAGNOSIS — E785 Hyperlipidemia, unspecified: Secondary | ICD-10-CM | POA: Diagnosis not present

## 2023-04-26 DIAGNOSIS — H109 Unspecified conjunctivitis: Secondary | ICD-10-CM | POA: Diagnosis not present

## 2023-05-07 DIAGNOSIS — Z136 Encounter for screening for cardiovascular disorders: Secondary | ICD-10-CM | POA: Diagnosis not present

## 2023-05-07 DIAGNOSIS — Z0000 Encounter for general adult medical examination without abnormal findings: Secondary | ICD-10-CM | POA: Diagnosis not present

## 2023-05-07 DIAGNOSIS — E785 Hyperlipidemia, unspecified: Secondary | ICD-10-CM | POA: Diagnosis not present

## 2023-05-07 DIAGNOSIS — Z139 Encounter for screening, unspecified: Secondary | ICD-10-CM | POA: Diagnosis not present

## 2023-05-07 DIAGNOSIS — E1129 Type 2 diabetes mellitus with other diabetic kidney complication: Secondary | ICD-10-CM | POA: Diagnosis not present

## 2023-05-07 DIAGNOSIS — R809 Proteinuria, unspecified: Secondary | ICD-10-CM | POA: Diagnosis not present

## 2023-05-07 DIAGNOSIS — I10 Essential (primary) hypertension: Secondary | ICD-10-CM | POA: Diagnosis not present
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
## 2023-06-10 DIAGNOSIS — W5503XA Scratched by cat, initial encounter: Secondary | ICD-10-CM | POA: Diagnosis not present

## 2023-06-10 DIAGNOSIS — L739 Follicular disorder, unspecified: Secondary | ICD-10-CM | POA: Diagnosis not present

## 2023-06-13 DIAGNOSIS — Z1211 Encounter for screening for malignant neoplasm of colon: Secondary | ICD-10-CM | POA: Diagnosis not present

## 2023-06-13 DIAGNOSIS — L739 Follicular disorder, unspecified: Secondary | ICD-10-CM | POA: Diagnosis not present

## 2023-06-20 DIAGNOSIS — R062 Wheezing: Secondary | ICD-10-CM | POA: Diagnosis not present

## 2023-06-20 DIAGNOSIS — Z20822 Contact with and (suspected) exposure to covid-19: Secondary | ICD-10-CM | POA: Diagnosis not present

## 2023-06-20 DIAGNOSIS — R059 Cough, unspecified: Secondary | ICD-10-CM | POA: Diagnosis not present

## 2023-07-31 DIAGNOSIS — E1165 Type 2 diabetes mellitus with hyperglycemia: Secondary | ICD-10-CM | POA: Diagnosis not present

## 2023-07-31 DIAGNOSIS — I10 Essential (primary) hypertension: Secondary | ICD-10-CM | POA: Diagnosis not present

## 2023-07-31 DIAGNOSIS — E785 Hyperlipidemia, unspecified: Secondary | ICD-10-CM | POA: Diagnosis not present
